# Patient Record
Sex: Female | Born: 1955 | Race: White | Hispanic: No | Marital: Married | Smoking: Former smoker
Health system: Southern US, Community
[De-identification: ages and names within clinical notes are randomized; demographics above are authoritative.]

## PROBLEM LIST (undated history)

## (undated) DIAGNOSIS — I10 Essential (primary) hypertension: Secondary | ICD-10-CM

## (undated) HISTORY — DX: Essential (primary) hypertension: I10

---

## 2004-04-18 ENCOUNTER — Ambulatory Visit: Payer: Self-pay | Admitting: Unknown Physician Specialty

## 2005-05-11 ENCOUNTER — Ambulatory Visit: Payer: Self-pay | Admitting: Unknown Physician Specialty

## 2005-05-17 ENCOUNTER — Ambulatory Visit: Payer: Self-pay | Admitting: Unknown Physician Specialty

## 2005-07-27 LAB — HM COLONOSCOPY: HM Colonoscopy: NORMAL

## 2005-08-02 ENCOUNTER — Ambulatory Visit: Payer: Self-pay | Admitting: Gastroenterology

## 2006-05-14 ENCOUNTER — Ambulatory Visit: Payer: Self-pay | Admitting: Unknown Physician Specialty

## 2006-05-16 ENCOUNTER — Ambulatory Visit: Payer: Self-pay | Admitting: Unknown Physician Specialty

## 2007-05-20 ENCOUNTER — Ambulatory Visit: Payer: Self-pay | Admitting: Unknown Physician Specialty

## 2008-05-25 ENCOUNTER — Ambulatory Visit: Payer: Self-pay | Admitting: Unknown Physician Specialty

## 2009-07-27 ENCOUNTER — Ambulatory Visit: Payer: Self-pay | Admitting: General Practice

## 2010-02-26 LAB — HM PAP SMEAR: HM Pap smear: NORMAL

## 2010-08-03 ENCOUNTER — Ambulatory Visit: Payer: Self-pay | Admitting: General Practice

## 2012-01-15 ENCOUNTER — Ambulatory Visit: Payer: Self-pay | Admitting: General Practice

## 2012-08-06 ENCOUNTER — Ambulatory Visit: Payer: Self-pay | Admitting: General Practice

## 2013-12-27 LAB — HM MAMMOGRAPHY: HM MAMMO: NORMAL

## 2014-01-21 ENCOUNTER — Ambulatory Visit: Payer: Self-pay | Admitting: General Practice

## 2014-02-26 ENCOUNTER — Encounter (INDEPENDENT_AMBULATORY_CARE_PROVIDER_SITE_OTHER): Payer: Self-pay

## 2014-02-26 ENCOUNTER — Encounter: Payer: Self-pay | Admitting: Internal Medicine

## 2014-02-26 ENCOUNTER — Ambulatory Visit (INDEPENDENT_AMBULATORY_CARE_PROVIDER_SITE_OTHER): Payer: No Typology Code available for payment source | Admitting: Internal Medicine

## 2014-02-26 VITALS — BP 124/74 | HR 84 | Temp 98.1°F | Resp 16 | Ht 63.0 in | Wt 139.0 lb

## 2014-02-26 DIAGNOSIS — Z0000 Encounter for general adult medical examination without abnormal findings: Secondary | ICD-10-CM

## 2014-02-26 DIAGNOSIS — Z0001 Encounter for general adult medical examination with abnormal findings: Secondary | ICD-10-CM | POA: Insufficient documentation

## 2014-02-26 DIAGNOSIS — M19041 Primary osteoarthritis, right hand: Secondary | ICD-10-CM

## 2014-02-26 DIAGNOSIS — L900 Lichen sclerosus et atrophicus: Secondary | ICD-10-CM

## 2014-02-26 DIAGNOSIS — I10 Essential (primary) hypertension: Secondary | ICD-10-CM

## 2014-02-26 DIAGNOSIS — Z124 Encounter for screening for malignant neoplasm of cervix: Secondary | ICD-10-CM

## 2014-02-26 MED ORDER — CLOBETASOL PROPIONATE 0.05 % EX OINT: 1.0000 "application " | TOPICAL_OINTMENT | Freq: Two times a day (BID) | CUTANEOUS | Status: DC

## 2014-02-26 NOTE — Progress Notes (Signed)
Patient ID: Elizabeth Hanna, female   DOB: [birth year], 58 y.o.   MRN: [id number]   Subjective:    Elizabeth Hanna is a 58 y.o. female who presents for an annual exam. The patient has no complaints today. The patient is not currently sexually active. GYN screening history: last pap: approximate date 2012 and was normal. The patient wears seatbelts: yes. The patient participates in regular exercise: yes. Has the patient ever been transfused or tattooed?: no. The patient reports that there is not domestic violence in her life.   Menstrual History: OB History   Grav Para Term Preterm Abortions TAB SAB Ect Mult Living                  Menarche age: 64  No LMP recorded. Patient is postmenopausal.    The following portions of the patient's history were reviewed and updated as appropriate: allergies, current medications, past family history, past medical history, past social history, past surgical history and problem list.  Review of Systems A comprehensive review of systems was negative.    Objective:      General Appearance:    Alert, cooperative, no distress, appears stated age  Head:    Normocephalic, without obvious abnormality, atraumatic  Eyes:    PERRL, conjunctiva/corneas clear, EOM's intact, fundi    benign, both eyes  Ears:    Normal TM's and external ear canals, both ears  Nose:   Nares normal, septum midline, mucosa normal, no drainage    or sinus tenderness  Throat:   Lips, mucosa, and tongue normal; teeth and gums normal  Neck:   Supple, symmetrical, trachea midline, no adenopathy;    thyroid:  no enlargement/tenderness/nodules; no carotid   bruit or JVD  Back:     Symmetric, no curvature, ROM normal, no CVA tenderness  Lungs:     Clear to auscultation bilaterally, respirations unlabored  Chest Wall:    No tenderness or deformity   Heart:    Regular rate and rhythm, S1 and S2 normal, no murmur, rub   or gallop  Breast Exam:    No tenderness, masses, or nipple  abnormality  Abdomen:     Soft, non-tender, bowel sounds active all four quadrants,    no masses, no organomegaly  Genitalia:    Pelvic: cervix normal in appearance, external genitalia normal except for , no adnexal masses or tenderness, no cervical motion tenderness, rectovaginal septum normal, uterus normal size, shape, and consistency and vagina normal without discharge  Extremities:   Extremities normal, atraumatic, no cyanosis or edema  Pulses:   2+ and symmetric all extremities  Skin:   Skin color, texture, turgor normal, no rashes or lesions  Lymph nodes:   Cervical, supraclavicular, and axillary nodes normal  Neurologic:   CNII-XII intact, normal strength, sensation and reflexes    throughout   .    Assessment and Plan:   Essential hypertension Well controlled on current regimen. no changes today.  Visit for preventive health examination Annual wellness  exam was done as well as a comprehensive physical exam and management of acute and chronic conditions .  During the course of the visit the patient was educated and counseled about appropriate screening and preventive services including : fall prevention , diabetes screening, nutrition counseling, colorectal cancer screening, and recommended immunizations.  Printed recommendations for health maintenance screenings was given.   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  Trial of clobetasol bid x 4 weeks  Updated Medication List Outpatient Encounter Prescriptions as of 02/26/2014  Medication Sig  . clobetasol ointment (TEMOVATE) 0.05 % Apply 1 application topically 2 (two) times daily.  . metoprolol tartrate (LOPRESSOR) 25 MG tablet Take 25 mg by mouth daily.

## 2014-02-26 NOTE — Patient Instructions (Addendum)
I am treating you for a condition called "Lichen sclerosis et atrophics" that affects the vaginal folds often after menopasue,  Use the steroid cream twice daily for 4 weeks,  Then suspend,    This is  my version of a  "Low GI"  Diet:  It will still lower your blood sugars and allow you to lose 4 to 8  lbs  per month if you follow it carefully.  Your goal with exercise is a minimum of 30 minutes of aerobic exercise 5 days per week (Walking does not count once it becomes easy!)      All of the foods can be found at grocery stores and in bulk at [employer].  The Atkins protein bars and shakes are available in more varieties at [employer], [employer] and [place].     7 AM Breakfast:  Choose from the following:  Low carbohydrate Protein  Shakes (I recommend the EAS AdvantEdge "Carb Control" shakes  Or the low carb shakes by Atkins.    2.5 carbs   Arnold's "Sandwhich Thin"toasted  w/ peanut butter (no jelly: about 20 net carbs  "Bagel Thin" with cream cheese and salmon: about 20 carbs   a scrambled egg/bacon/cheese burrito made with Mission's "carb balance" whole wheat tortilla  (about 10 net carbs )  A slice of home made fritatta (egg based dish without a crust:  google it)    Avoid cereal and bananas, oatmeal and cream of wheat and grits. They are loaded with carbohydrates!   10 AM: high protein snack  Protein bar by Atkins (the snack size, under 200 cal, usually < 6 net carbs).    A stick of cheese:  Around 1 carb,  100 cal     Dannon Light n Fit [place] Yogurt  (80 cal, 8 carbs)  Other so called "protein bars" and Greek yogurts tend to be loaded with carbohydrates.  Remember, in food advertising, the word "energy" is synonymous for " carbohydrate."  Lunch:   A Sandwich using the bread choices listed, Can use any  Eggs,  lunchmeat, grilled meat or canned tuna), avocado, regular mayo/mustard  and cheese.  A Salad using blue cheese, ranch,  Goddess or vinagrette,  No croutons or "confetti" and no  "candied nuts" but regular nuts OK.   No pretzels or chips.  Pickles and miniature sweet peppers are a good low carb alternative that provide a "crunch"  The bread is the only source of carbohydrate in a sandwich and  can be decreased by trying some of these alternatives to traditional loaf bread  Joseph's makes a pita bread and a flat bread that are 50 cal and 4 net carbs available at [place] and [address].  This can be toasted to use with hummous as well  Toufayan makes a low carb flatbread that's 100 cal and 9 net carbs available at [employer] and [employer]'s makes 2 sizes of  Low carb whole wheat tortilla  (The large one is 210 cal and 6 net carbs) Avoid "Low fat dressings, as well as Barry Brunner and [place] dressings They are loaded with sugar!   3 PM/ Mid day  Snack:  Consider  1 ounce of  almonds, walnuts, pistachios, pecans, peanuts,  Macadamia nuts or a nut medley.  Avoid "granola"; the dried cranberries and raisins are loaded with carbohydrates. Mixed nuts as long as there are no raisins,  cranberries or dried fruit.    Try the prosciutto/mozzarella cheese sticks by [employer]  In deli /backery section   High protein   To avoid overindulging in snacks: Try drinking a glass of unsweeted almond/coconut milk  Or a cup of coffee with your Atkins chocolate bar t o keep you from having 3!!!        6 PM  Dinner:     Meat/fowl/fish with a green salad, and either broccoli, cauliflower, green beans, spinach, brussel sprouts or  Lima beans. DO NOT BREAD THE PROTEIN!!      There is a low carb pasta by Dreamfield's that is acceptable and tastes great: only 5 digestible carbs/serving.( All grocery stores but BJs carry it )  Try Hurley Cisco Angelo's chicken piccata or chicken or eggplant parm over low carb pasta.(Lowes and BJs)   Marjory Lies Sanchez's "Carnitas" (pulled pork, no sauce,  0 carbs) or his beef pot roast to make a dinner burrito (at BJ's)  Pesto over low carb pasta (bj's sells a good quality pesto  in the center refrigerated section of the deli   Try satueeing  Cheral Marker with mushroooms  Whole wheat pasta is still full of digestible carbs and  Not as low in glycemic index as Dreamfield's.   Brown rice is still rice,  So skip the rice and noodles if you eat [place] or [place] (or at least limit to 1/2 cup)  9 PM snack :   Breyer's "low carb" fudgsicle or  ice cream bar (Carb Smart line), or  Weight Watcher's ice cream bar , or another "no sugar added" ice cream;  a serving of fresh berries/cherries with whipped cream   Cheese or DANNON'S LlGHT N FIT GREEK YOGURT or the [place] greek yogurt   8 ounces of [place] unsweetened almond/cococunut milk    Avoid bananas, pineapple, grapes  and watermelon on a regular basis because they are high in sugar.  THINK OF THEM AS DESSERT  Remember that snack Substitutions should be less than 10 NET carbs per serving and meals < 20 carbs. Remember to subtract fiber grams to get the "net carbs."  Health Maintenance Adopting a healthy lifestyle and getting preventive care can go a long way to promote health and wellness. Talk with your health care provider about what schedule of regular examinations is right for you. This is a good chance for you to check in with your provider about disease prevention and staying healthy. In between checkups, there are plenty of things you can do on your own. Experts have done a lot of research about which lifestyle changes and preventive measures are most likely to keep you healthy. Ask your health care provider for more information. WEIGHT AND DIET  Eat a healthy diet  Be sure to include plenty of vegetables, fruits, low-fat dairy products, and lean protein.  Do not eat a lot of foods high in solid fats, added sugars, or salt.  Get regular exercise. This is one of the most important things you can do for your health.  Most adults should exercise for at least 150 minutes each week. The exercise should increase your  heart rate and make you sweat (moderate-intensity exercise).  Most adults should also do strengthening exercises at least twice a week. This is in addition to the moderate-intensity exercise.  Maintain a healthy weight  Body mass index (BMI) is a measurement that can be used to identify possible weight problems. It estimates body fat based on height and weight. Your health care provider can help determine your BMI and help you achieve or maintain  a healthy weight.  For females 73 years of age and older:   A BMI below 18.5 is considered underweight.  A BMI of 18.5 to 24.9 is normal.  A BMI of 25 to 29.9 is considered overweight.  A BMI of 30 and above is considered obese.  Watch levels of cholesterol and blood lipids  You should start having your blood tested for lipids and cholesterol at 59 years of age, then have this test every 5 years.  You may need to have your cholesterol levels checked more often if:  Your lipid or cholesterol levels are high.  You are older than 59 years of age.  You are at high risk for heart disease.  CANCER SCREENING   Lung Cancer  Lung cancer screening is recommended for adults 39-85 years old who are at high risk for lung cancer because of a history of smoking.  A yearly low-dose CT scan of the lungs is recommended for people who:  Currently smoke.  Have quit within the past 15 years.  Have at least a 30-pack-year history of smoking. A pack year is smoking an average of one pack of cigarettes a day for 1 year.  Yearly screening should continue until it has been 15 years since you quit.  Yearly screening should stop if you develop a health problem that would prevent you from having lung cancer treatment.  Breast Cancer  Practice breast self-awareness. This means understanding how your breasts normally appear and feel.  It also means doing regular breast self-exams. Let your health care provider know about any changes, no matter how  small.  If you are in your 20s or 30s, you should have a clinical breast exam (CBE) by a health care provider every 1-3 years as part of a regular health exam.  If you are 80 or older, have a CBE every year. Also consider having a breast X-ray (mammogram) every year.  If you have a family history of breast cancer, talk to your health care provider about genetic screening.  If you are at high risk for breast cancer, talk to your health care provider about having an MRI and a mammogram every year.  Breast cancer gene (BRCA) assessment is recommended for women who have family members with BRCA-related cancers. BRCA-related cancers include:  Breast.  Ovarian.  Tubal.  Peritoneal cancers.  Results of the assessment will determine the need for genetic counseling and BRCA1 and BRCA2 testing. Cervical Cancer Routine pelvic examinations to screen for cervical cancer are no longer recommended for nonpregnant women who are considered low risk for cancer of the pelvic organs (ovaries, uterus, and vagina) and who do not have symptoms. A pelvic examination may be necessary if you have symptoms including those associated with pelvic infections. Ask your health care provider if a screening pelvic exam is right for you.   The Pap test is the screening test for cervical cancer for women who are considered at risk.  If you had a hysterectomy for a problem that was not cancer or a condition that could lead to cancer, then you no longer need Pap tests.  If you are older than 65 years, and you have had normal Pap tests for the past 10 years, you no longer need to have Pap tests.  If you have had past treatment for cervical cancer or a condition that could lead to cancer, you need Pap tests and screening for cancer for at least 20 years after your treatment.  If you  no longer get a Pap test, assess your risk factors if they change (such as having a new sexual partner). This can affect whether you should  start being screened again.  Some women have medical problems that increase their chance of getting cervical cancer. If this is the case for you, your health care provider may recommend more frequent screening and Pap tests.  The human papillomavirus (HPV) test is another test that may be used for cervical cancer screening. The HPV test looks for the virus that can cause cell changes in the cervix. The cells collected during the Pap test can be tested for HPV.  The HPV test can be used to screen women 51 years of age and older. Getting tested for HPV can extend the interval between normal Pap tests from three to five years.  An HPV test also should be used to screen women of any age who have unclear Pap test results.  After 59 years of age, women should have HPV testing as often as Pap tests.  Colorectal Cancer  This type of cancer can be detected and often prevented.  Routine colorectal cancer screening usually begins at 59 years of age and continues through 59 years of age.  Your health care provider may recommend screening at an earlier age if you have risk factors for colon cancer.  Your health care provider may also recommend using home test kits to check for hidden blood in the stool.  A small camera at the end of a tube can be used to examine your colon directly (sigmoidoscopy or colonoscopy). This is done to check for the earliest forms of colorectal cancer.  Routine screening usually begins at age 26.  Direct examination of the colon should be repeated every 5-10 years through 59 years of age. However, you may need to be screened more often if early forms of precancerous polyps or small growths are found. Skin Cancer  Check your skin from head to toe regularly.  Tell your health care provider about any new moles or changes in moles, especially if there is a change in a mole's shape or color.  Also tell your health care provider if you have a mole that is larger than the size  of a pencil eraser.  Always use sunscreen. Apply sunscreen liberally and repeatedly throughout the day.  Protect yourself by wearing long sleeves, pants, a wide-brimmed hat, and sunglasses whenever you are outside. HEART DISEASE, DIABETES, AND HIGH BLOOD PRESSURE   Have your blood pressure checked at least every 1-2 years. High blood pressure causes heart disease and increases the risk of stroke.  If you are between 56 years and 62 years old, ask your health care provider if you should take aspirin to prevent strokes.  Have regular diabetes screenings. This involves taking a blood sample to check your fasting blood sugar level.  If you are at a normal weight and have a low risk for diabetes, have this test once every three years after 59 years of age.  If you are overweight and have a high risk for diabetes, consider being tested at a younger age or more often. PREVENTING INFECTION  Hepatitis B  If you have a higher risk for hepatitis B, you should be screened for this virus. You are considered at high risk for hepatitis B if:  You were born in a country where hepatitis B is common. Ask your health care provider which countries are considered high risk.  Your parents were born in  a high-risk country, and you have not been immunized against hepatitis B (hepatitis B vaccine).  You have HIV or AIDS.  You use needles to inject street drugs.  You live with someone who has hepatitis B.  You have had sex with someone who has hepatitis B.  You get hemodialysis treatment.  You take certain medicines for conditions, including cancer, organ transplantation, and autoimmune conditions. Hepatitis C  Blood testing is recommended for:  Everyone born from [birth year] through 1966.  Anyone with known risk factors for hepatitis C. Sexually transmitted infections (STIs)  You should be screened for sexually transmitted infections (STIs) including gonorrhea and chlamydia if:  You are sexually  active and are younger than 59 years of age.  You are older than 59 years of age and your health care provider tells you that you are at risk for this type of infection.  Your sexual activity has changed since you were last screened and you are at an increased risk for chlamydia or gonorrhea. Ask your health care provider if you are at risk.  If you do not have HIV, but are at risk, it may be recommended that you take a prescription medicine daily to prevent HIV infection. This is called pre-exposure prophylaxis (PrEP). You are considered at risk if:  You are sexually active and do not regularly use condoms or know the HIV status of your partner(s).  You take drugs by injection.  You are sexually active with a partner who has HIV. Talk with your health care provider about whether you are at high risk of being infected with HIV. If you choose to begin PrEP, you should first be tested for HIV. You should then be tested every 3 months for as long as you are taking PrEP.  PREGNANCY   If you are premenopausal and you may become pregnant, ask your health care provider about preconception counseling.  If you may become pregnant, take 400 to 800 micrograms (mcg) of folic acid every day.  If you want to prevent pregnancy, talk to your health care provider about birth control (contraception). OSTEOPOROSIS AND MENOPAUSE   Osteoporosis is a disease in which the bones lose minerals and strength with aging. This can result in serious bone fractures. Your risk for osteoporosis can be identified using a bone density scan.  If you are 62 years of age or older, or if you are at risk for osteoporosis and fractures, ask your health care provider if you should be screened.  Ask your health care provider whether you should take a calcium or vitamin D supplement to lower your risk for osteoporosis.  Menopause may have certain physical symptoms and risks.  Hormone replacement therapy may reduce some of these  symptoms and risks. Talk to your health care provider about whether hormone replacement therapy is right for you.  HOME CARE INSTRUCTIONS   Schedule regular health, dental, and eye exams.  Stay current with your immunizations.   Do not use any tobacco products including cigarettes, chewing tobacco, or electronic cigarettes.  If you are pregnant, do not drink alcohol.  If you are breastfeeding, limit how much and how often you drink alcohol.  Limit alcohol intake to no more than 1 drink per day for nonpregnant women. One drink equals 12 ounces of beer, 5 ounces of wine, or 1 ounces of hard liquor.  Do not use street drugs.  Do not share needles.  Ask your health care provider for help if you need support or information  about quitting drugs.  Tell your health care provider if you often feel depressed.  Tell your health care provider if you have ever been abused or do not feel safe at home. Document Released: 11/27/2010 Document Revised: 09/28/2013 Document Reviewed: 04/15/2013 [facility] Patient Information 2015 [place], [place]. This information is not intended to replace advice given to you by your health care provider. Make sure you discuss any questions you have with your health care provider.

## 2014-02-26 NOTE — Progress Notes (Signed)
Pre-visit discussion using our clinic review tool. No additional management support is needed unless otherwise documented below in the visit note.  

## 2014-02-28 ENCOUNTER — Encounter: Payer: Self-pay | Admitting: Internal Medicine

## 2014-02-28 DIAGNOSIS — L900 Lichen sclerosus et atrophicus: Secondary | ICD-10-CM | POA: Insufficient documentation

## 2014-02-28 NOTE — Assessment & Plan Note (Addendum)
Well controlled on current regimen.  no changes today.   

## 2014-02-28 NOTE — Assessment & Plan Note (Signed)
Affecting the right labia.  Trial of clobetasol bid x 4 weeks

## 2014-02-28 NOTE — Assessment & Plan Note (Addendum)
Annual  wellness  exam was done as well as a comprehensive physical exam and management of acute and chronic conditions .  During the course of the visit the patient was educated and counseled about appropriate screening and preventive services including : fall prevention , diabetes screening, nutrition counseling, colorectal cancer screening, and recommended immunizations.  Printed recommendations for health maintenance screenings was given.  

## 2014-03-01 ENCOUNTER — Other Ambulatory Visit (HOSPITAL_COMMUNITY)
Admission: RE | Admit: 2014-03-01 | Discharge: 2014-03-01 | Disposition: A | Discharge status: Home / Self Care | Payer: No Typology Code available for payment source | Source: Ambulatory Visit | Attending: Internal Medicine | Admitting: Internal Medicine

## 2014-03-01 ENCOUNTER — Telehealth: Payer: Self-pay | Admitting: Internal Medicine

## 2014-03-01 DIAGNOSIS — Z1151 Encounter for screening for human papillomavirus (HPV): Secondary | ICD-10-CM | POA: Diagnosis present

## 2014-03-01 DIAGNOSIS — Z01411 Encounter for gynecological examination (general) (routine) with abnormal findings: Secondary | ICD-10-CM | POA: Diagnosis not present

## 2014-03-01 NOTE — Addendum Note (Signed)
Addended by: Montine CircleMALDONADO, Alisson Rozell D on: 03/01/2014 04:39 PM   Modules accepted: Orders

## 2014-03-01 NOTE — Telephone Encounter (Signed)
emmi emailed °

## 2014-03-02 LAB — CYTOLOGY - PAP

## 2015-04-04 ENCOUNTER — Encounter: Payer: Self-pay | Admitting: Physician Assistant

## 2015-04-04 ENCOUNTER — Ambulatory Visit: Payer: Self-pay | Admitting: Physician Assistant

## 2015-04-04 VITALS — BP 132/70 | HR 86 | Temp 98.1°F | Wt 146.2 lb

## 2015-04-04 DIAGNOSIS — Z299 Encounter for prophylactic measures, unspecified: Secondary | ICD-10-CM

## 2015-04-04 DIAGNOSIS — I10 Essential (primary) hypertension: Secondary | ICD-10-CM

## 2015-04-04 MED ORDER — METOPROLOL TARTRATE 25 MG PO TABS: 25.0000 mg | ORAL_TABLET | Freq: Every day | ORAL | Status: DC

## 2015-04-04 NOTE — Progress Notes (Signed)
S: here for yearly exam, no changes in her health in past year, has gained a little weight and had to go up a size in pants, still working out at gym regularly and trying to eat right, had a couple of chest like pressures that passed in few seconds, denies cp/sob/dizziness during workouts, denies changes in bowel habits, chronic cough, etc; remainder ros neg  O: vitals wnl nad, normocephalic Ent:  tms clear, nasal mucosa pink, throat wnl, neck supple no lymph Lungs: Normal effort. Lungs are clear to auscultation, no crackles or wheezes Cardiovascular: Regular rate and rhythm, no edema Abd: soft nontender bs normal all 4 quads Musculoskeletal:  Neurovascularly intact Neurological: No new neurological deficits  A: htn controlled with medication  P: labs also send copy to dr Darrick Huntsmantullo as she is pt's pcp; pt to add more protein into diet and eat 6 small meals to aid in weight loss, [facility], pt needs colonoscopy as its been 10 years, wants to wait until first of year and will have pcp order this

## 2015-04-04 NOTE — Addendum Note (Signed)
Addended by: Catha BrowEACON, MONIQUE T on: 04/04/2015 09:11 AM   Modules accepted: Orders

## 2015-04-05 LAB — CMP12+LP+TP+TSH+6AC+CBC/D/PLT
ALBUMIN: 4.4 g/dL (ref 3.6–4.8)
ALK PHOS: 52 IU/L (ref 39–117)
ALT: 16 IU/L (ref 0–32)
AST: 17 IU/L (ref 0–40)
Albumin/Globulin Ratio: 2.3 (ref 1.1–2.5)
BASOS: 1 %
BILIRUBIN TOTAL: 0.5 mg/dL (ref 0.0–1.2)
BUN/Creatinine Ratio: 25 (ref 11–26)
BUN: 14 mg/dL (ref 8–27)
Basophils Absolute: 0 10*3/uL (ref 0.0–0.2)
CALCIUM: 9.4 mg/dL (ref 8.7–10.3)
Chloride: 101 mmol/L (ref 97–106)
Chol/HDL Ratio: 2.4 ratio units (ref 0.0–4.4)
Cholesterol, Total: 192 mg/dL (ref 100–199)
Creatinine, Ser: 0.56 mg/dL — ABNORMAL LOW (ref 0.57–1.00)
EOS (ABSOLUTE): 0.1 10*3/uL (ref 0.0–0.4)
Eos: 1 %
Estimated CHD Risk: <0.5 times avg. (ref 0.0–1.0)
Free Thyroxine Index: 2.5 (ref 1.2–4.9)
GFR calc non Af Amer: 102 mL/min/{1.73_m2} (ref >59)
GFR, EST AFRICAN AMERICAN: 117 mL/min/{1.73_m2} (ref >59)
GGT: 15 IU/L (ref 0–60)
GLOBULIN, TOTAL: 1.9 g/dL (ref 1.5–4.5)
Glucose: 99 mg/dL (ref 65–99)
HDL: 79 mg/dL (ref >39)
HEMATOCRIT: 44 % (ref 34.0–46.6)
Hemoglobin: 14.6 g/dL (ref 11.1–15.9)
IMMATURE GRANS (ABS): 0 10*3/uL (ref 0.0–0.1)
Immature Granulocytes: 0 %
Iron: 145 ug/dL (ref 27–159)
LDH: 161 IU/L (ref 119–226)
LDL CALC: 100 mg/dL — AB (ref 0–99)
LYMPHS: 36 %
Lymphocytes Absolute: 2.1 10*3/uL (ref 0.7–3.1)
MCH: 30.5 pg (ref 26.6–33.0)
MCHC: 33.2 g/dL (ref 31.5–35.7)
MCV: 92 fL (ref 79–97)
MONOCYTES: 7 %
MONOS ABS: 0.4 10*3/uL (ref 0.1–0.9)
NEUTROS ABS: 3.2 10*3/uL (ref 1.4–7.0)
Neutrophils: 55 %
POTASSIUM: 4.3 mmol/L (ref 3.5–5.2)
Phosphorus: 3.2 mg/dL (ref 2.5–4.5)
Platelets: 262 10*3/uL (ref 150–379)
RBC: 4.78 x10E6/uL (ref 3.77–5.28)
RDW: 12.6 % (ref 12.3–15.4)
SODIUM: 140 mmol/L (ref 136–144)
T3 Uptake Ratio: 28 % (ref 24–39)
T4, Total: 8.8 ug/dL (ref 4.5–12.0)
TRIGLYCERIDES: 63 mg/dL (ref 0–149)
TSH: 1.59 u[IU]/mL (ref 0.450–4.500)
Total Protein: 6.3 g/dL (ref 6.0–8.5)
Uric Acid: 4.7 mg/dL (ref 2.5–7.1)
VLDL CHOLESTEROL CAL: 13 mg/dL (ref 5–40)
WBC: 5.8 10*3/uL (ref 3.4–10.8)

## 2015-04-05 LAB — HEPATITIS C ANTIBODY (REFLEX): HCV Ab: <0.1 s/co ratio (ref 0.0–0.9)

## 2015-04-05 LAB — HCV COMMENT:

## 2015-04-05 LAB — VITAMIN D 25 HYDROXY (VIT D DEFICIENCY, FRACTURES): Vit D, 25-Hydroxy: 22.9 ng/mL — ABNORMAL LOW (ref 30.0–100.0)

## 2015-04-08 ENCOUNTER — Encounter: Payer: Self-pay | Admitting: Emergency Medicine

## 2015-04-08 NOTE — Progress Notes (Signed)
Lab results were mailed to patient's home address.

## 2015-05-31 ENCOUNTER — Ambulatory Visit (INDEPENDENT_AMBULATORY_CARE_PROVIDER_SITE_OTHER): Payer: 59 | Admitting: Internal Medicine

## 2015-05-31 ENCOUNTER — Encounter: Payer: Self-pay | Admitting: Internal Medicine

## 2015-05-31 ENCOUNTER — Other Ambulatory Visit (HOSPITAL_COMMUNITY)
Admission: RE | Admit: 2015-05-31 | Discharge: 2015-05-31 | Disposition: A | Discharge status: Home / Self Care | Payer: 59 | Source: Ambulatory Visit | Attending: Internal Medicine | Admitting: Internal Medicine

## 2015-05-31 VITALS — BP 128/78 | HR 70 | Temp 97.9°F | Resp 12 | Ht 63.0 in | Wt 149.1 lb

## 2015-05-31 DIAGNOSIS — Z01411 Encounter for gynecological examination (general) (routine) with abnormal findings: Secondary | ICD-10-CM | POA: Diagnosis present

## 2015-05-31 DIAGNOSIS — L900 Lichen sclerosus et atrophicus: Secondary | ICD-10-CM | POA: Diagnosis not present

## 2015-05-31 DIAGNOSIS — R896 Abnormal cytological findings in specimens from other organs, systems and tissues: Secondary | ICD-10-CM | POA: Diagnosis not present

## 2015-05-31 DIAGNOSIS — IMO0002 Reserved for concepts with insufficient information to code with codable children: Secondary | ICD-10-CM

## 2015-05-31 DIAGNOSIS — Z1151 Encounter for screening for human papillomavirus (HPV): Secondary | ICD-10-CM | POA: Insufficient documentation

## 2015-05-31 DIAGNOSIS — E559 Vitamin D deficiency, unspecified: Secondary | ICD-10-CM | POA: Diagnosis not present

## 2015-05-31 DIAGNOSIS — B977 Papillomavirus as the cause of diseases classified elsewhere: Secondary | ICD-10-CM

## 2015-05-31 DIAGNOSIS — Z0000 Encounter for general adult medical examination without abnormal findings: Secondary | ICD-10-CM | POA: Diagnosis not present

## 2015-05-31 MED ORDER — CLOBETASOL PROPIONATE 0.05 % EX OINT: 1.0000 "application " | TOPICAL_OINTMENT | Freq: Two times a day (BID) | CUTANEOUS | Status: DC

## 2015-05-31 MED ORDER — ERGOCALCIFEROL 1.25 MG (50000 UT) PO CAPS: 50000.0000 [IU] | ORAL_CAPSULE | ORAL | Status: DC

## 2015-05-31 NOTE — Patient Instructions (Signed)
Your vitamin D is low, which can increase your risk of weak bones and fractures and interfere with your body's ability to absorb the calcium in your diet.   I am calling in a megadose of Vit D to take once weekly for a total of 3 months,  Then after you finish the weekly supplement, you should start taking an OTC  Vit D3 supplement 1000 units daily.    If your PAP smear is abnormal again,  I will recommend that you see my gynecologist Dr Servando Salina for further evaluation  I have refilled the clobetasol ointment  I recommend getting the 3D mammogram ANNUALLY  for dense breasts   Menopause is a normal process in which your reproductive ability comes to an end. This process happens gradually over a span of months to years, usually between the ages of 42 and 68. Menopause is complete when you have missed 12 consecutive menstrual periods. It is important to talk with your health care provider about some of the most common conditions that affect postmenopausal women, such as heart disease, cancer, and bone loss (osteoporosis). Adopting a healthy lifestyle and getting preventive care can help to promote your health and wellness. Those actions can also lower your chances of developing some of these common conditions. WHAT SHOULD I KNOW ABOUT MENOPAUSE? During menopause, you may experience a number of symptoms, such as:  Moderate-to-severe hot flashes.  Night sweats.  Decrease in sex drive.  Mood swings.  Headaches.  Tiredness.  Irritability.  Memory problems.  Insomnia. Choosing to treat or not to treat menopausal changes is an individual decision that you make with your health care provider. WHAT SHOULD I KNOW ABOUT HORMONE REPLACEMENT THERAPY AND SUPPLEMENTS? Hormone therapy products are effective for treating symptoms that are associated with menopause, such as hot flashes and night sweats. Hormone replacement carries certain risks, especially as you become older. If you are  thinking about using estrogen or estrogen with progestin treatments, discuss the benefits and risks with your health care provider. WHAT SHOULD I KNOW ABOUT HEART DISEASE AND STROKE? Heart disease, heart attack, and stroke become more likely as you age. This may be due, in part, to the hormonal changes that your body experiences during menopause. These can affect how your body processes dietary fats, triglycerides, and cholesterol. Heart attack and stroke are both medical emergencies. There are many things that you can do to help prevent heart disease and stroke:  Have your blood pressure checked at least every 1-2 years. High blood pressure causes heart disease and increases the risk of stroke.  If you are 6-12 years old, ask your health care provider if you should take aspirin to prevent a heart attack or a stroke.  Do not use any tobacco products, including cigarettes, chewing tobacco, or electronic cigarettes. If you need help quitting, ask your health care provider.  It is important to eat a healthy diet and maintain a healthy weight.  Be sure to include plenty of vegetables, fruits, low-fat dairy products, and lean protein.  Avoid eating foods that are high in solid fats, added sugars, or salt (sodium).  Get regular exercise. This is one of the most important things that you can do for your health.  Try to exercise for at least 150 minutes each week. The type of exercise that you do should increase your heart rate and make you sweat. This is known as moderate-intensity exercise.  Try to do strengthening exercises at least twice each week. Do these  in addition to the moderate-intensity exercise.  Know your numbers.Ask your health care provider to check your cholesterol and your blood glucose. Continue to have your blood tested as directed by your health care provider. WHAT SHOULD I KNOW ABOUT CANCER SCREENING? There are several types of cancer. Take the following steps to reduce your  risk and to catch any cancer development as early as possible. Breast Cancer  Practice breast self-awareness.  This means understanding how your breasts normally appear and feel.  It also means doing regular breast self-exams. Let your health care provider know about any changes, no matter how small.  If you are 60 or older, have a clinician do a breast exam (clinical breast exam or CBE) every year. Depending on your age, family history, and medical history, it may be recommended that you also have a yearly breast X-ray (mammogram).  If you have a family history of breast cancer, talk with your health care provider about genetic screening.  If you are at high risk for breast cancer, talk with your health care provider about having an MRI and a mammogram every year.  Breast cancer (BRCA) gene test is recommended for women who have family members with BRCA-related cancers. Results of the assessment will determine the need for genetic counseling and BRCA1 and for BRCA2 testing. BRCA-related cancers include these types:  Breast. This occurs in males or females.  Ovarian.  Tubal. This may also be called fallopian tube cancer.  Cancer of the abdominal or pelvic lining (peritoneal cancer).  Prostate.  Pancreatic. Cervical, Uterine, and Ovarian Cancer Your health care provider may recommend that you be screened regularly for cancer of the pelvic organs. These include your ovaries, uterus, and vagina. This screening involves a pelvic exam, which includes checking for microscopic changes to the surface of your cervix (Pap test).  For women ages 21-65, health care providers may recommend a pelvic exam and a Pap test every three years. For women ages 51-65, they may recommend the Pap test and pelvic exam, combined with testing for human papilloma virus (HPV), every five years. Some types of HPV increase your risk of cervical cancer. Testing for HPV may also be done on women of any age who have  unclear Pap test results.  Other health care providers may not recommend any screening for nonpregnant women who are considered low risk for pelvic cancer and have no symptoms. Ask your health care provider if a screening pelvic exam is right for you.  If you have had past treatment for cervical cancer or a condition that could lead to cancer, you need Pap tests and screening for cancer for at least 20 years after your treatment. If Pap tests have been discontinued for you, your risk factors (such as having a new sexual partner) need to be reassessed to determine if you should start having screenings again. Some women have medical problems that increase the chance of getting cervical cancer. In these cases, your health care provider may recommend that you have screening and Pap tests more often.  If you have a family history of uterine cancer or ovarian cancer, talk with your health care provider about genetic screening.  If you have vaginal bleeding after reaching menopause, tell your health care provider.  There are currently no reliable tests available to screen for ovarian cancer. Lung Cancer Lung cancer screening is recommended for adults 73-57 years old who are at high risk for lung cancer because of a history of smoking. A yearly low-dose  CT scan of the lungs is recommended if you:  Currently smoke.  Have a history of at least 30 pack-years of smoking and you currently smoke or have quit within the past 15 years. A pack-year is smoking an average of one pack of cigarettes per day for one year. Yearly screening should:  Continue until it has been 15 years since you quit.  Stop if you develop a health problem that would prevent you from having lung cancer treatment. Colorectal Cancer  This type of cancer can be detected and can often be prevented.  Routine colorectal cancer screening usually begins at age 98 and continues through age 9.  If you have risk factors for colon cancer,  your health care provider may recommend that you be screened at an earlier age.  If you have a family history of colorectal cancer, talk with your health care provider about genetic screening.  Your health care provider may also recommend using home test kits to check for hidden blood in your stool.  A small camera at the end of a tube can be used to examine your colon directly (sigmoidoscopy or colonoscopy). This is done to check for the earliest forms of colorectal cancer.  Direct examination of the colon should be repeated every 5-10 years until age 29. However, if early forms of precancerous polyps or small growths are found or if you have a family history or genetic risk for colorectal cancer, you may need to be screened more often. Skin Cancer  Check your skin from head to toe regularly.  Monitor any moles. Be sure to tell your health care provider:  About any new moles or changes in moles, especially if there is a change in a mole's shape or color.  If you have a mole that is larger than the size of a pencil eraser.  If any of your family members has a history of skin cancer, especially at a young age, talk with your health care provider about genetic screening.  Always use sunscreen. Apply sunscreen liberally and repeatedly throughout the day.  Whenever you are outside, protect yourself by wearing long sleeves, pants, a wide-brimmed hat, and sunglasses. WHAT SHOULD I KNOW ABOUT OSTEOPOROSIS? Osteoporosis is a condition in which bone destruction happens more quickly than new bone creation. After menopause, you may be at an increased risk for osteoporosis. To help prevent osteoporosis or the bone fractures that can happen because of osteoporosis, the following is recommended:  If you are 35-71 years old, get at least 1,000 mg of calcium and at least 600 mg of vitamin D per day.  If you are older than age 70 but younger than age 72, get at least 1,200 mg of calcium and at least 600  mg of vitamin D per day.  If you are older than age 16, get at least 1,200 mg of calcium and at least 800 mg of vitamin D per day. Smoking and excessive alcohol intake increase the risk of osteoporosis. Eat foods that are rich in calcium and vitamin D, and do weight-bearing exercises several times each week as directed by your health care provider. WHAT SHOULD I KNOW ABOUT HOW MENOPAUSE AFFECTS [place]? Depression may occur at any age, but it is more common as you become older. Common symptoms of depression include:  Low or sad mood.  Changes in sleep patterns.  Changes in appetite or eating patterns.  Feeling an overall lack of motivation or enjoyment of activities that you previously enjoyed.  Frequent crying spells. Talk with your health care provider if you think that you are experiencing depression. WHAT SHOULD I KNOW ABOUT IMMUNIZATIONS? It is important that you get and maintain your immunizations. These include:  Tetanus, diphtheria, and pertussis (Tdap) booster vaccine.  Influenza every year before the flu season begins.  Pneumonia vaccine.  Shingles vaccine. Your health care provider may also recommend other immunizations.   This information is not intended to replace advice given to you by your health care provider. Make sure you discuss any questions you have with your health care provider.   Document Released: 07/06/2005 Document Revised: 06/04/2014 Document Reviewed: 01/14/2014 Elsevier Interactive Patient Education [employer].

## 2015-05-31 NOTE — Progress Notes (Signed)
Patient ID: Elizabeth Hanna, female    DOB: 05-21-1956  Age: 60 y.o. MRN: [id number]  The patient is here for annual  wellness examination and management of other chronic and acute problems.   ASCUs,  HPV negative 2016  Screening labs normal, but Vit D was 22  Mammogram normal [place] 2015    The risk factors are reflected in the social history.  The roster of all physicians providing medical care to patient - is listed in the Snapshot section of the chart.  Home safety : The patient has smoke detectors in the home. They wear seatbelts.  There are no firearms at home. There is no violence in the home.   There is no risks for hepatitis, STDs or HIV. There is no   history of blood transfusion. They have no travel history to infectious disease endemic areas of the world.  The patient has seen their dentist in the last six month. They have seen their eye doctor in the last year. They admit to slight hearing difficulty with regard to whispered voices and some television programs.  They have deferred audiologic testing in the last year.  They do not  have excessive sun exposure. Discussed the need for sun protection: hats, long sleeves and use of sunscreen if there is significant sun exposure.   Diet: the importance of a healthy diet is discussed. They do have a healthy diet.  The benefits of regular aerobic exercise were discussed. She walks 4 times per week ,  20 minutes.   Depression screen: there are no signs or vegative symptoms of depression- irritability, change in appetite, anhedonia, sadness/tearfullness.   The following portions of the patient's history were reviewed and updated as appropriate: allergies, current medications, past family history, past medical history,  past surgical history, past social history  and problem list.  Visual acuity was not assessed per patient preference since she has regular follow up with her ophthalmologist. Hearing and body mass index were  assessed and reviewed.   During the course of the visit the patient was educated and counseled about appropriate screening and preventive services including : fall prevention , diabetes screening, nutrition counseling, colorectal cancer screening, and recommended immunizations.    CC: The primary encounter diagnosis was Vitamin D deficiency. 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  Weight gain despite rigorous exercise daily.twice daily.   Vaginal itching resolved with clobetasol,  Recurs occasionally toward the end of the week starts with an irritated area in the superior region of the  labia minora and spreads caudally resolves after 2-3 days of use of ointment     History Elizabeth Hanna has a past medical history of Hypertension.   She has past surgical history that includes Cesarean section (1981).   Her family history includes Heart disease in her father.She reports that she quit smoking about 16 years ago. Her smoking use included Cigarettes. She has never used smokeless tobacco. She reports that she drinks alcohol. She reports that she does not use illicit drugs.  Outpatient Prescriptions Prior to Visit  Medication Sig Dispense Refill  . metoprolol tartrate (LOPRESSOR) 25 MG tablet Take 1 tablet (25 mg total) by mouth daily. 30 tablet 6  . clobetasol ointment (TEMOVATE) 0.05 % Apply 1 application topically 2 (two) times daily. (Patient not taking: Reported on 05/31/2015) 30 g 0   No  facility-administered medications prior to visit.    Review of Systems   Patient denies headache, fevers, malaise, unintentional weight loss, skin rash, eye pain, sinus congestion and sinus pain, sore throat, dysphagia,  hemoptysis , cough, dyspnea, wheezing, chest pain, palpitations, orthopnea, edema, abdominal pain, nausea, melena,  diarrhea, constipation, flank pain, dysuria, hematuria, urinary  Frequency, nocturia, numbness, tingling, seizures,  Focal weakness, Loss of consciousness,  Tremor, insomnia, depression, anxiety, and suicidal ideation.      Objective:  BP 128/78 mmHg  Pulse 70  Temp(Src) 97.9 F (36.6 C) (Oral)  Resp 12  Ht 5\' 3"  (1.6 m)  Wt 149 lb 2 oz (67.643 kg)  BMI 26.42 kg/m2  SpO2 97%  Physical Exam  General Appearance:    Alert, cooperative, no distress, appears stated age  Head:    Normocephalic, without obvious abnormality, atraumatic  Eyes:    PERRL, conjunctiva/corneas clear, EOM's intact, fundi    benign, both eyes  Ears:    Normal TM's and external ear canals, both ears  Nose:   Nares normal, septum midline, mucosa normal, no drainage    or sinus tenderness  Throat:   Lips, mucosa, and tongue normal; teeth and gums normal  Neck:   Supple, symmetrical, trachea midline, no adenopathy;    thyroid:  no enlargement/tenderness/nodules; no carotid   bruit or JVD  Back:     Symmetric, no curvature, ROM normal, no CVA tenderness  Lungs:     Clear to auscultation bilaterally, respirations unlabored  Chest Wall:    No tenderness or deformity   Heart:    Regular rate and rhythm, S1 and S2 normal, no murmur, rub   or gallop  Breast Exam:    No tenderness, masses, or nipple abnormality  Abdomen:     Soft, non-tender, bowel sounds active all four quadrants,    no masses, no organomegaly  Genitalia:    Pelvic: cervix normal in appearance, external genitalia normal, no adnexal masses or tenderness, no cervical motion tenderness, rectovaginal septum normal, uterus normal size, shape, and consistency and vagina normal without discharge  Extremities:   Extremities normal, atraumatic, no cyanosis or edema  Pulses:   2+ and symmetric all extremities  Skin:   Skin color, texture, turgor normal, no rashes or lesions  Lymph nodes:   Cervical, supraclavicular, and axillary nodes normal  Neurologic:    CNII-XII intact, normal strength, sensation and reflexes    throughout    Assessment & Plan:   Problem List Items Addressed This Visit    Visit for preventive health examination    Annual comprehensive preventive exam was done as well as an evaluation and management of chronic conditions .  During the course of the visit the patient was educated and counseled about appropriate screening and preventive services including :  diabetes screening, lipid analysis, nutrition counseling, colorectal cancer screening, and recommended immunizations.  Printed recommendations for health maintenance screenings was given.       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 Cheatum start on ergocalciferol. I am also having her maintain her metoprolol tartrate and clobetasol ointment.  Meds ordered this encounter  Medications  . ergocalciferol (DRISDOL) 50000 units capsule    Sig: Take 1 capsule (50,000 Units total) by mouth once a week.    Dispense:  12 capsule    Refill:  0  . clobetasol ointment (TEMOVATE) 0.05 %    Sig: Apply 1 application topically 2 (two) times daily.    Dispense:  30 g    Refill:  1    Medications Discontinued During This Encounter  Medication Reason  . clobetasol ointment (TEMOVATE) 0.05 % Reorder    Follow-up: No Follow-up on file.   Sherlene ShamsULLO, Yaretzi Ernandez L, MD

## 2015-05-31 NOTE — Progress Notes (Signed)
Pre-visit discussion using our clinic review tool. No additional management support is needed unless otherwise documented below in the visit note.  

## 2015-05-31 NOTE — Assessment & Plan Note (Signed)
Recommended use of Drisdol weekly x 3 months

## 2015-05-31 NOTE — Assessment & Plan Note (Signed)
Annual comprehensive preventive exam was done as well as an evaluation and management of chronic conditions .  During the course of the visit the patient was educated and counseled about appropriate screening and preventive services including :  diabetes screening, lipid analysis, nutrition counseling, colorectal cancer screening, and recommended immunizations.  Printed recommendations for health maintenance screenings was given.   

## 2015-05-31 NOTE — Assessment & Plan Note (Signed)
Early, suggested by history adn rapid resolution with clobetasol

## 2015-06-03 LAB — CYTOLOGY - PAP

## 2015-06-07 ENCOUNTER — Encounter: Payer: Self-pay | Admitting: *Deleted

## 2015-10-03 ENCOUNTER — Encounter: Payer: Self-pay | Admitting: Physician Assistant

## 2015-10-03 ENCOUNTER — Ambulatory Visit: Payer: Self-pay | Admitting: Physician Assistant

## 2015-10-03 VITALS — BP 119/70 | HR 79 | Temp 97.8°F

## 2015-10-03 DIAGNOSIS — J018 Other acute sinusitis: Secondary | ICD-10-CM

## 2015-10-03 MED ORDER — FLUTICASONE PROPIONATE 50 MCG/ACT NA SUSP: 2.0000 | Freq: Every day | NASAL | Status: DC

## 2015-10-03 MED ORDER — AZITHROMYCIN 250 MG PO TABS: ORAL_TABLET | ORAL | Status: DC

## 2015-10-03 NOTE — Progress Notes (Signed)
S: C/o runny nose and congestion for 7 days, no fever, chills, cp/sob, v/d; mucus is green and thick, cough is sporadic, c/o of facial and dental pain.   Using otc meds:   O: PE: vitals wnl, nad,  perrl eomi, normocephalic, tms dull, nasal mucosa red and swollen, throat injected, neck supple no lymph, lungs c t a, cv rrr, neuro intact  A:  Acute sinusitis   P: zpack, flonase; drink fluids, continue regular meds , use otc meds of choice, return if not improving in 5 days, return earlier if worsening

## 2015-11-11 ENCOUNTER — Other Ambulatory Visit: Payer: Self-pay | Admitting: Physician Assistant

## 2015-11-14 ENCOUNTER — Other Ambulatory Visit: Payer: Self-pay | Admitting: Physician Assistant

## 2015-11-14 NOTE — Telephone Encounter (Signed)
Med refill approved 

## 2016-02-27 ENCOUNTER — Other Ambulatory Visit: Payer: Self-pay | Admitting: Internal Medicine

## 2016-02-27 DIAGNOSIS — Z1231 Encounter for screening mammogram for malignant neoplasm of breast: Secondary | ICD-10-CM

## 2016-03-14 ENCOUNTER — Ambulatory Visit
Admission: RE | Admit: 2016-03-14 | Discharge: 2016-03-14 | Disposition: A | Discharge status: Home / Self Care | Payer: 59 | Source: Ambulatory Visit | Attending: Internal Medicine | Admitting: Internal Medicine

## 2016-03-14 DIAGNOSIS — Z1231 Encounter for screening mammogram for malignant neoplasm of breast: Secondary | ICD-10-CM | POA: Diagnosis not present

## 2016-05-23 ENCOUNTER — Other Ambulatory Visit: Payer: Self-pay

## 2016-05-23 DIAGNOSIS — Z299 Encounter for prophylactic measures, unspecified: Secondary | ICD-10-CM

## 2016-05-23 NOTE — Progress Notes (Signed)
Patient came in to have blood drawn for testing.  Patient has an appointment coming up with Dr. Darrick Huntsmanullo and wanted to get her labs done before her appointment.

## 2016-05-24 LAB — CMP12+LP+TP+TSH+6AC+CBC/D/PLT
ALBUMIN: 4.5 g/dL (ref 3.6–4.8)
ALT: 18 IU/L (ref 0–32)
AST: 13 IU/L (ref 0–40)
Albumin/Globulin Ratio: 2.4 — ABNORMAL HIGH (ref 1.2–2.2)
Alkaline Phosphatase: 55 IU/L (ref 39–117)
BUN/Creatinine Ratio: 27 (ref 12–28)
BUN: 15 mg/dL (ref 8–27)
Basophils Absolute: 0 10*3/uL (ref 0.0–0.2)
Basos: 1 %
Bilirubin Total: 0.4 mg/dL (ref 0.0–1.2)
CALCIUM: 9.4 mg/dL (ref 8.7–10.3)
CHOLESTEROL TOTAL: 185 mg/dL (ref 100–199)
Chloride: 104 mmol/L (ref 96–106)
Chol/HDL Ratio: 2.8 ratio units (ref 0.0–4.4)
Creatinine, Ser: 0.55 mg/dL — ABNORMAL LOW (ref 0.57–1.00)
EOS (ABSOLUTE): 0.1 10*3/uL (ref 0.0–0.4)
Eos: 1 %
Estimated CHD Risk: <0.5 times avg. (ref 0.0–1.0)
FREE THYROXINE INDEX: 1.7 (ref 1.2–4.9)
GFR calc Af Amer: 117 mL/min/{1.73_m2} (ref >59)
GFR calc non Af Amer: 102 mL/min/{1.73_m2} (ref >59)
GGT: 13 IU/L (ref 0–60)
GLOBULIN, TOTAL: 1.9 g/dL (ref 1.5–4.5)
Glucose: 109 mg/dL — ABNORMAL HIGH (ref 65–99)
HDL: 65 mg/dL (ref >39)
Hematocrit: 43.9 % (ref 34.0–46.6)
Hemoglobin: 15.1 g/dL (ref 11.1–15.9)
IMMATURE GRANS (ABS): 0 10*3/uL (ref 0.0–0.1)
Immature Granulocytes: 0 %
Iron: 114 ug/dL (ref 27–139)
LDH: 158 IU/L (ref 119–226)
LDL Calculated: 105 mg/dL — ABNORMAL HIGH (ref 0–99)
LYMPHS ABS: 1.8 10*3/uL (ref 0.7–3.1)
LYMPHS: 35 %
MCH: 31.7 pg (ref 26.6–33.0)
MCHC: 34.4 g/dL (ref 31.5–35.7)
MCV: 92 fL (ref 79–97)
MONOS ABS: 0.3 10*3/uL (ref 0.1–0.9)
Monocytes: 7 %
NEUTROS ABS: 2.8 10*3/uL (ref 1.4–7.0)
NEUTROS PCT: 56 %
PHOSPHORUS: 2.9 mg/dL (ref 2.5–4.5)
POTASSIUM: 4.6 mmol/L (ref 3.5–5.2)
Platelets: 242 10*3/uL (ref 150–379)
RBC: 4.76 x10E6/uL (ref 3.77–5.28)
RDW: 12.8 % (ref 12.3–15.4)
Sodium: 143 mmol/L (ref 134–144)
T3 Uptake Ratio: 25 % (ref 24–39)
T4 TOTAL: 6.7 ug/dL (ref 4.5–12.0)
TRIGLYCERIDES: 74 mg/dL (ref 0–149)
TSH: 0.996 u[IU]/mL (ref 0.450–4.500)
Total Protein: 6.4 g/dL (ref 6.0–8.5)
Uric Acid: 4.9 mg/dL (ref 2.5–7.1)
VLDL Cholesterol Cal: 15 mg/dL (ref 5–40)
WBC: 5 10*3/uL (ref 3.4–10.8)

## 2016-05-24 LAB — VITAMIN D 25 HYDROXY (VIT D DEFICIENCY, FRACTURES): Vit D, 25-Hydroxy: 20.2 ng/mL — ABNORMAL LOW (ref 30.0–100.0)

## 2016-05-31 ENCOUNTER — Ambulatory Visit (INDEPENDENT_AMBULATORY_CARE_PROVIDER_SITE_OTHER): Payer: 59 | Admitting: Internal Medicine

## 2016-05-31 ENCOUNTER — Encounter: Payer: Self-pay | Admitting: Internal Medicine

## 2016-05-31 ENCOUNTER — Other Ambulatory Visit: Payer: Self-pay

## 2016-05-31 VITALS — BP 140/86 | HR 84 | Temp 98.2°F | Resp 16 | Ht 62.25 in | Wt 149.5 lb

## 2016-05-31 DIAGNOSIS — E663 Overweight: Secondary | ICD-10-CM

## 2016-05-31 DIAGNOSIS — I10 Essential (primary) hypertension: Secondary | ICD-10-CM

## 2016-05-31 DIAGNOSIS — E559 Vitamin D deficiency, unspecified: Secondary | ICD-10-CM

## 2016-05-31 DIAGNOSIS — Z0000 Encounter for general adult medical examination without abnormal findings: Secondary | ICD-10-CM

## 2016-05-31 DIAGNOSIS — Z299 Encounter for prophylactic measures, unspecified: Secondary | ICD-10-CM

## 2016-05-31 DIAGNOSIS — E785 Hyperlipidemia, unspecified: Secondary | ICD-10-CM | POA: Diagnosis not present

## 2016-05-31 DIAGNOSIS — R1013 Epigastric pain: Secondary | ICD-10-CM

## 2016-05-31 MED ORDER — ERGOCALCIFEROL 1.25 MG (50000 UT) PO CAPS: 50000.0000 [IU] | ORAL_CAPSULE | ORAL | 3 refills | Status: DC

## 2016-05-31 MED ORDER — METOPROLOL TARTRATE 25 MG PO TABS: 25.0000 mg | ORAL_TABLET | Freq: Every day | ORAL | 1 refills | Status: DC

## 2016-05-31 NOTE — Patient Instructions (Signed)
1) check on your history of receiving Tdap vaccine  2) Shingrx vaccine is highly recommended for prevention of shingles.  3) Have your liver and pancreatic enzymes checked ASAP 4) cologuard has been ordered and will be sent to your home address 5) check BP 5 times over the next month  Goal is 120/70 or less based on new guidelines from the [facility] 6) I'll calculate your risk of heart attack based on fasting lipid panel and let you know if statin therapy is advised  The  diet I discussed with you today is the 10 day Green Smoothie Cleansing /Detox Diet by Brooke DareJJ Smith . available on [employer] for around $10.  It does require a blender, (Vita Mix, a electric juicer,  Or a Nutribullet Rx).  This is not a low carb or a weight loss diet,  It is fundamentally a "cleansing" low fat diet that eliminates sugar, gluten, caffeine, alcohol and dairy for 10 days .  What you add back after the initial ten days is entirely up to  you!  You can expect to lose 5 to 10 lbs depending on how strict you are.   I found that  drinking 2 smoothies or juices  daily and keeping one chewable meal (but keep it simple, like baked fish and salad, rice or bok choy) kept me satisfied and kept me from straying  .  You snack primarily on fresh  fruit, egg whites and judicious quantities of nuts.  You can add a  vegetable based protein powder  to any smoothie made with almond milk (nothing with whey , since whey is dairy)  [employer] has a few but  the [employer] has the greatest  selection .  Using frozen fruits is much more convenient and cost effective. You can even find plenty of organic fruit in the frozen fruit section of [employer].  Just thaw what you need for the following day the night before in the refrigerator (to avoid jamming up your machine)   The organic vegan protein powder I tried  is called Vega" and I found it at [employer] .  It is sugar free. Tastes like crap.  My advice:  Dorna BloomChew your protein  (eat an egg or two in the am with  your smoothie or add soy yogurt for protein ) ,  Don't ruin the taste of your smoothies with protein powder unless you can find one you really love.

## 2016-05-31 NOTE — Progress Notes (Addendum)
Patient ID: Elizabeth MilesShelia C Hanna, female    DOB: 1955-10-23  Age: 61 y.o. MRN: [id number]  The patient is here for annual physical examination and management of other chronic and acute problems.    PAP normal Jan 2017 Mammogram oct 2017 Due for colonoscopy vs cologuard  The risk factors are reflected in the social history.  The roster of all physicians providing medical care to patient - is listed in the Snapshot section of the chart.  Home safety : The patient has smoke detectors in the home. They wear seatbelts.  There are no firearms at home. There is no violence in the home.   There is no risks for hepatitis, STDs or HIV. There is no   history of blood transfusion. They have no travel history to infectious disease endemic areas of the world.  The patient has seen their dentist in the last six month. They have seen their eye doctor in the last year.  They do not  have excessive sun exposure. Discussed the need for sun protection: hats, long sleeves and use of sunscreen if there is significant sun exposure. She has regular annual dermatology follow up   Diet: the importance of a healthy diet is discussed. They do have a healthy diet.  The benefits of regular aerobic exercise were discussed. She exercises vigorously 4 to 5 days per week, 60 mintues .   Depression screen: there are no signs or vegative symptoms of depression- irritability, change in appetite, anhedonia, sadness/tearfullness.  The following portions of the patient's history were reviewed and updated as appropriate: allergies, current medications, past family history, past medical history,  past surgical history, past social history  and problem list.  Visual acuity was not assessed per patient preference since she has regular follow up with her ophthalmologist. Hearing and body mass index were assessed and reviewed.   During the course of the visit the patient was educated and counseled about appropriate screening and  preventive services including : fall prevention , diabetes screening, nutrition counseling, colorectal cancer screening, and recommended immunizations.    CC: The primary encounter diagnosis was Visit for preventive health examination. Diagnoses of Hyperlipidemia LDL goal <100, Vitamin D deficiency, Essential hypertension, Overweight (BMI 25.0-29.9), and Epigastric pain were also pertinent to this visit.  1) recent episode of abdominal pain. patient reports 2 isolate episodes of sudden onset of mid epigastric pain accompanied by a feeling of distension.  Episodes lasted about an hour or 2, were not accompanied by nausea, shortness of breath, dark colored urine, or change in bowel habits and were not associated with recent ingestion of food or alcohol .   2) Hypertension: patient checks blood pressure twice weekly at home.  Readings have been for the most part<> 140/80 at rest . Patient is following a reduced salt diet most days and is taking medications as prescribed.  3) overweight: she remains very frustrated by her inability to lose weight despite exercising regularly .  Diet and exercise program reviewed in detail.   History Elizabeth Hanna has a past medical history of Hypertension.   She has a past surgical history that includes Cesarean section (1981).   Her family history includes Heart disease in her father.She reports that she quit smoking about 17 years ago. Her smoking use included Cigarettes. She has never used smokeless tobacco. She reports that she drinks alcohol. She reports that she does not use drugs.  Outpatient Medications Prior to Visit  Medication Sig Dispense Refill  . metoprolol tartrate (LOPRESSOR)  25 MG tablet TAKE ONE TABLET BY MOUTH ONCE DAILY 30 tablet 6  . azithromycin (ZITHROMAX Z-PAK) 250 MG tablet 2 pills today then 1 pill a day for 4 days 6 each 0  . clobetasol ointment (TEMOVATE) 0.05 % Apply 1 application topically 2 (two) times daily. 30 g 1  . ergocalciferol  (DRISDOL) 50000 units capsule Take 1 capsule (50,000 Units total) by mouth once a week. 12 capsule 0  . fluticasone (FLONASE) 50 MCG/ACT nasal spray Place 2 sprays into both nostrils daily. 16 g 6   No facility-administered medications prior to visit.     Review of Systems   Patient denies headache, fevers, malaise, unintentional weight loss, skin rash, eye pain, sinus congestion and sinus pain, sore throat, dysphagia,  hemoptysis , cough, dyspnea, wheezing, chest pain, palpitations, orthopnea, edema, abdominal pain, nausea, melena, diarrhea, constipation, flank pain, dysuria, hematuria, urinary  Frequency, nocturia, numbness, tingling, seizures,  Focal weakness, Loss of consciousness,  Tremor, insomnia, depression, anxiety, and suicidal ideation.      Objective:  BP 140/86   Pulse 84   Temp 98.2 F (36.8 C) (Oral)   Resp 16   Ht 5' 2.25" (1.581 m)   Wt 149 lb 8 oz (67.8 kg)   SpO2 96%   BMI 27.12 kg/m   Physical Exam   General appearance: alert, cooperative and appears stated age Head: Normocephalic, without obvious abnormality, atraumatic Eyes: conjunctivae/corneas clear. PERRL, EOM's intact. Fundi benign. Ears: normal TM's and external ear canals both ears Nose: Nares normal. Septum midline. Mucosa normal. No drainage or sinus tenderness. Throat: lips, mucosa, and tongue normal; teeth and gums normal Neck: no adenopathy, no carotid bruit, no JVD, supple, symmetrical, trachea midline and thyroid not enlarged, symmetric, no tenderness/mass/nodules Lungs: clear to auscultation bilaterally Breasts: normal appearance, no masses or tenderness Heart: regular rate and rhythm, S1, S2 normal, no murmur, click, rub or gallop Abdomen: soft, non-tender; bowel sounds normal; no masses,  no organomegaly Extremities: extremities normal, atraumatic, no cyanosis or edema Pulses: 2+ and symmetric Skin: Skin color, texture, turgor normal. No rashes or lesions Neurologic: Alert and oriented X  3, normal strength and tone. Normal symmetric reflexes. Normal coordination and gait.     Assessment & Plan:   Problem List Items Addressed This Visit    Abdominal pain    Intermittent, with 2 distinct episodes.  Repeat liver enzymes and lipase are normal, abdominal exam is  normal. Recommending trial of famotidine /ranitidine and abd ultrasound for next occurrence.       Essential hypertension    elevation noted today. Despite use of metoprolol. .  Patient has been asked to check bp at home and submit readings in 2 weeks.       Relevant Medications   metoprolol tartrate (LOPRESSOR) 25 MG tablet   Hyperlipidemia LDL goal <100    Using the Framingham risk calculator,  her 10 year risk of coronary artery disease is 9.6%.  The [employer] recommends starting patients with this level of risk  on low  intensity statin therapy to lower your risks of these events. will recommend trial of simvastatin .   Lab Results  Component Value Date   CHOL 185 05/23/2016   HDL 65 05/23/2016   LDLCALC 105 (H) 05/23/2016   TRIG 74 05/23/2016   CHOLHDL 2.8 05/23/2016  '      Relevant Medications   metoprolol tartrate (LOPRESSOR) 25 MG tablet   Overweight (BMI 25.0-29.9)    I have  addressed  BMI and recommended wt loss of 10% of body weight over the next 6 months using a low fat, low starch, high protein  fruit/vegetable based Mediterranean diet .  She is already participating in over 30 minutes of aerobic exercise a minimum of 5 days per week.        Visit for preventive health examination - Primary    Annual comprehensive preventive exam was done as well as an evaluation and management of chronic conditions .  During the course of the visit the patient was educated and counseled about appropriate screening and preventive services including :  diabetes screening, lipid analysis with projected  10 year  risk for CAD , nutrition counseling, breast, cervical and colorectal cancer  screening, and recommended immunizations.  Printed recommendations for health maintenance screenings was given      Vitamin D deficiency    Low at 20,  rx drisdol weekly x 3 months          I have discontinued Ms. Brightwell's clobetasol ointment, azithromycin, and fluticasone. I have also changed her metoprolol tartrate. Additionally, I am having her maintain her ergocalciferol.  Meds ordered this encounter  Medications  . ergocalciferol (DRISDOL) 50000 units capsule    Sig: Take 1 capsule (50,000 Units total) by mouth once a week.    Dispense:  4 capsule    Refill:  3  . metoprolol tartrate (LOPRESSOR) 25 MG tablet    Sig: Take 1 tablet (25 mg total) by mouth daily.    Dispense:  90 tablet    Refill:  1    Medications Discontinued During This Encounter  Medication Reason  . azithromycin (ZITHROMAX Z-PAK) 250 MG tablet Completed Course  . clobetasol ointment (TEMOVATE) 0.05 % Completed Course  . ergocalciferol (DRISDOL) 50000 units capsule Completed Course  . fluticasone (FLONASE) 50 MCG/ACT nasal spray Patient Discharge  . metoprolol tartrate (LOPRESSOR) 25 MG tablet Reorder    Follow-up: No Follow-up on file.   [name], MD

## 2016-05-31 NOTE — Progress Notes (Signed)
Patient came in to have blood drawn for testing per Dr. Tullo's orders. 

## 2016-05-31 NOTE — Progress Notes (Signed)
Pre-visit discussion using our clinic review tool. No additional management support is needed unless otherwise documented below in the visit note.  

## 2016-06-01 LAB — HEPATIC FUNCTION PANEL
ALK PHOS: 58 IU/L (ref 39–117)
ALT: 21 IU/L (ref 0–32)
AST: 15 IU/L (ref 0–40)
Albumin: 4.8 g/dL (ref 3.6–4.8)
Bilirubin Total: 0.3 mg/dL (ref 0.0–1.2)
Bilirubin, Direct: 0.09 mg/dL (ref 0.00–0.40)
Total Protein: 6.9 g/dL (ref 6.0–8.5)

## 2016-06-01 LAB — LIPASE: Lipase: 30 U/L (ref 14–72)

## 2016-06-02 ENCOUNTER — Telehealth: Payer: Self-pay | Admitting: Internal Medicine

## 2016-06-02 DIAGNOSIS — E663 Overweight: Secondary | ICD-10-CM | POA: Insufficient documentation

## 2016-06-02 DIAGNOSIS — R109 Unspecified abdominal pain: Secondary | ICD-10-CM | POA: Insufficient documentation

## 2016-06-02 DIAGNOSIS — E785 Hyperlipidemia, unspecified: Secondary | ICD-10-CM | POA: Insufficient documentation

## 2016-06-02 NOTE — Assessment & Plan Note (Signed)
Low at 20,  rx drisdol weekly x 3 months

## 2016-06-02 NOTE — Assessment & Plan Note (Signed)
elevation noted today. Despite use of metoprolol. .  Patient has been asked to check bp at home and submit readings in 2 weeks.

## 2016-06-02 NOTE — Assessment & Plan Note (Signed)
Intermittent, with 2 distinct episodes.  Repeat liver enzymes and lipase are normal, abdominal exam is  normal. Recommending trial of famotidine /ranitidine and abd ultrasound for next occurrence.

## 2016-06-02 NOTE — Telephone Encounter (Signed)
I calculated your risk of coronary artery disease  based on recent fasting lipid panel. Based on your fasting cholesterol and your concurrent history of hypertension ,  your 10 year risk of having some type of vascular  event (including heart attack and stroke ) is mildly elevated at 9.6%, meaning one of of every 10 women with the same medical statistics will have some kind of "event" in the next 10 years.    The [employer] recommends starting patients with this level of risk  on low  intensity statin therapy to lower your risks of these events.  Therefore I would recommend a trial of  simvastatin (generic for Zocor) to lower your risk of heart attacks and strokes.  If you are willing to do so,  Let me know and I will send a prescription to your pharmacy.  This medication will require repeat blood work about 6 weeks after you start it to make sure it is not affecting your liver.  If you decide not do, Please consider a trial of red Yeast Rice as a natural remedy.  The natural remedies for cholesterol have not been proven to reduce your risk for a heart attack.  Red Yeast Rice has not been proven either,  But it  does lower cholesterol, so if you want to try it , the dose is 600 mg twice daily in capsule form, available OTC.  It does require monitoring of liver enzymes,  Just like the statins,  So if you decide to start it,  I would like you to repeat labs in 6 weeks which would be a non fasting hepatic panel , and in 6 months  A fasting lipid and hepatic panel.     Regards,  Dr. Darrick Huntsmanullo

## 2016-06-02 NOTE — Assessment & Plan Note (Addendum)
Using the Framingham risk calculator,  her 10 year risk of coronary artery disease is 9.6%.  The [employer] recommends starting patients with this level of risk  on low  intensity statin therapy to lower your risks of these events. will recommend trial of simvastatin .   Lab Results  Component Value Date   CHOL 185 05/23/2016   HDL 65 05/23/2016   LDLCALC 105 (H) 05/23/2016   TRIG 74 05/23/2016   CHOLHDL 2.8 05/23/2016  '

## 2016-06-02 NOTE — Assessment & Plan Note (Signed)
Annual comprehensive preventive exam was done as well as an evaluation and management of chronic conditions .  During the course of the visit the patient was educated and counseled about appropriate screening and preventive services including :  diabetes screening, lipid analysis with projected  10 year  risk for CAD , nutrition counseling, breast, cervical and colorectal cancer screening, and recommended immunizations.  Printed recommendations for health maintenance screenings was given 

## 2016-06-02 NOTE — Assessment & Plan Note (Signed)
I have addressed  BMI and recommended wt loss of 10% of body weight over the next 6 months using a low fat, low starch, high protein  fruit/vegetable based Mediterranean diet .  She is already participating in over 30 minutes of aerobic exercise a minimum of 5 days per week.

## 2016-06-04 LAB — COLOGUARD: COLOGUARD: NEGATIVE

## 2016-06-04 NOTE — Telephone Encounter (Signed)
Letter has been sent

## 2016-06-11 ENCOUNTER — Telehealth: Payer: Self-pay

## 2016-06-11 ENCOUNTER — Encounter: Payer: Self-pay | Admitting: Internal Medicine

## 2016-06-11 NOTE — Telephone Encounter (Signed)
Cologuard: Negative, Results Abstracted Please advise

## 2016-06-11 NOTE — Telephone Encounter (Signed)
My Chart message sent

## 2016-06-12 ENCOUNTER — Telehealth: Payer: Self-pay | Admitting: Internal Medicine

## 2016-06-12 NOTE — Telephone Encounter (Signed)
My Chart message sent

## 2016-08-27 ENCOUNTER — Encounter: Payer: Self-pay | Admitting: Physician Assistant

## 2016-08-27 ENCOUNTER — Ambulatory Visit: Payer: Self-pay | Admitting: Physician Assistant

## 2016-08-27 VITALS — BP 121/69 | HR 85 | Temp 98.3°F

## 2016-08-27 DIAGNOSIS — J029 Acute pharyngitis, unspecified: Secondary | ICD-10-CM

## 2016-08-27 MED ORDER — AZITHROMYCIN 250 MG PO TABS: ORAL_TABLET | ORAL | 0 refills | Status: DC

## 2016-08-27 NOTE — Progress Notes (Signed)
S: C/o sore throat, runny nose and congestion for 3 -4days, + fever, chills on friday, none since; denies cp/sob, v/d; mucus was green this am but clear throughout the day, cough is sporadic,   Using otc meds:   O: PE: vitals wnl, nad,  perrl eomi, normocephalic, tms dull, nasal mucosa red and swollen, throat red, neck supple no lymph, lungs c t a, cv rrr, neuro intact, f  A:  Acute pharyngitis   P: drink fluids, continue regular meds , use otc meds of choice, return if not improving in 5 days, return earlier if worsening , zpack

## 2016-12-12 ENCOUNTER — Ambulatory Visit: Payer: Self-pay | Admitting: Physician Assistant

## 2016-12-12 ENCOUNTER — Encounter: Payer: Self-pay | Admitting: Physician Assistant

## 2016-12-12 VITALS — BP 120/79 | HR 72 | Temp 98.5°F | Resp 16

## 2016-12-12 DIAGNOSIS — I10 Essential (primary) hypertension: Secondary | ICD-10-CM

## 2016-12-12 MED ORDER — METOPROLOL TARTRATE 25 MG PO TABS: 25.0000 mg | ORAL_TABLET | Freq: Every day | ORAL | 3 refills | Status: DC

## 2016-12-12 NOTE — Progress Notes (Signed)
S: here for med refill, no complaints, is running out of bp meds, will be retiring at the end of the month and is feeling excited about this, no cp/sob/swelling in feet or ankles, saw her pcp in January for full physical  O: vitals wnl, nad, lungs c t a, cv rrr, no pedal edema noted  A: htn  P: refill on lopressor, f/u with pcp as needed, able to use the clinic until age 89 as she is retiring with full benefits

## 2017-03-15 ENCOUNTER — Other Ambulatory Visit: Payer: Self-pay | Admitting: Internal Medicine

## 2017-03-15 DIAGNOSIS — Z1231 Encounter for screening mammogram for malignant neoplasm of breast: Secondary | ICD-10-CM

## 2017-04-09 ENCOUNTER — Ambulatory Visit
Admission: RE | Admit: 2017-04-09 | Discharge: 2017-04-09 | Disposition: A | Discharge status: Home / Self Care | Payer: 59 | Source: Ambulatory Visit | Attending: Internal Medicine | Admitting: Internal Medicine

## 2017-04-09 DIAGNOSIS — Z1231 Encounter for screening mammogram for malignant neoplasm of breast: Secondary | ICD-10-CM | POA: Diagnosis not present

## 2017-08-27 ENCOUNTER — Ambulatory Visit: Payer: Self-pay | Admitting: Family Medicine

## 2017-08-27 VITALS — BP 158/86 | HR 116 | Temp 98.5°F | Resp 20

## 2017-08-27 DIAGNOSIS — R509 Fever, unspecified: Secondary | ICD-10-CM

## 2017-08-27 DIAGNOSIS — J329 Chronic sinusitis, unspecified: Secondary | ICD-10-CM

## 2017-08-27 DIAGNOSIS — R52 Pain, unspecified: Secondary | ICD-10-CM

## 2017-08-27 LAB — POCT INFLUENZA A/B
Influenza A, POC: NEGATIVE
Influenza B, POC: NEGATIVE

## 2017-08-27 MED ORDER — AMOXICILLIN-POT CLAVULANATE 875-125 MG PO TABS: 1.0000 | ORAL_TABLET | Freq: Two times a day (BID) | ORAL | 0 refills | Status: AC

## 2017-08-27 NOTE — Progress Notes (Signed)
Subjective: Congestion     Elizabeth Hanna is a 62 y.o. female who presents for evaluation of nasal congestion with purulent sputum, cough, and sore throat.  Patient reports that for the last 2 weeks she has had mild upper respiratory infection symptoms of nasal congestion and rhinorrhea.  Reports that this weekend and she feels like she developed a low-grade fever but never checked her temperature.  Reports this has resolved completely and not recurred.  Has also felt some fatigue, body aches, and nausea since this weekend and that she developed a productive cough with green sputum this weekend as well.  Patient's resting pulse today is 116.  Patient reports this is normal for her and her primary care provider is aware of this.  Patient's blood pressure is elevated today at 156/86.  Patient reports that she has not taken her blood pressure medication today.  Patient reports she has been taking care of her daughter this past week who has the flu. Treatment to date: NyQuil as needed.  Denies rash, vomiting, diarrhea, wheezing, shortness of breath, chest or back pain, ear pain, difficulty swallowing, confusion, dental pain, facial pressure, headache, or severe symptoms. History of smoking, asthma, COPD: Negative. History of recurrent sinus and/or lung infections: Negative. Medical history: Hypertension, takes metoprolol for this.  Patient denies any other medical history. Antibiotic use in the last 3 months: Negative.   Review of Systems Pertinent items noted in HPI and remainder of comprehensive ROS otherwise negative.     Objective:   Physical Exam General: Awake, alert, and oriented. No acute distress. Well developed, hydrated and nourished. Appears stated age. Nontoxic appearance.  Afebrile. HEENT:  PND noted.  Mild erythema to posterior oropharynx.  No edema or exudates of pharynx or tonsils. No erythema or bulging of TM.  Mild erythema/edema to nasal mucosa.  Bilateral maxillary sinus  tenderness.  Remainder of sinuses nontender. Supple neck without adenopathy. Cardiac: Heart rate and rhythm are normal. No murmurs, gallops, or rubs are auscultated. S1 and S2 are heard and are of normal intensity.  Respiratory: No signs of respiratory distress. Lungs clear. No tachypnea. Able to speak in full sentences without dyspnea. Nonlabored respirations.  Skin: Skin is warm, dry and intact. Appropriate color for ethnicity. No cyanosis noted.   Diagnostic Results: Flu test negative. Oxygen saturation: 97% on room air.  Assessment:    sinusitis   Plan:    Discussed the diagnosis and treatment of sinusitis. Discussed the importance of avoiding unnecessary antibiotic therapy. Suggested symptomatic OTC remedies. Nasal saline spray for congestion.   Delayed antibiotic prescription for Augmentin provided.  Patient given explicit instructions regarding indications to fill this.  Not recommending filling this at this time due to the mild nature of the patient's symptoms.  Discussed side/adverse effects of this medication. Advised patient to monitor her blood pressure at home and report any abnormal findings to her primary care provider.  Discussed normal blood pressure findings.  Advised her to take her medication as prescribed.  Advised her to also discuss her elevated resting heart rate with her primary care provider. Follow-up with primary care provider. Discussed red flag symptoms and circumstances with which to seek medical care.

## 2017-09-02 ENCOUNTER — Ambulatory Visit: Payer: Self-pay

## 2017-12-09 ENCOUNTER — Other Ambulatory Visit: Payer: Self-pay

## 2017-12-09 NOTE — Progress Notes (Signed)
Pt didn't bring lab order. No labs done.

## 2017-12-12 ENCOUNTER — Ambulatory Visit (INDEPENDENT_AMBULATORY_CARE_PROVIDER_SITE_OTHER): Payer: Managed Care, Other (non HMO) | Admitting: Internal Medicine

## 2017-12-12 ENCOUNTER — Encounter: Payer: Self-pay | Admitting: Internal Medicine

## 2017-12-12 VITALS — BP 130/76 | HR 83 | Temp 97.9°F | Resp 14 | Ht 62.25 in | Wt 151.1 lb

## 2017-12-12 DIAGNOSIS — Z1231 Encounter for screening mammogram for malignant neoplasm of breast: Secondary | ICD-10-CM

## 2017-12-12 DIAGNOSIS — Z113 Encounter for screening for infections with a predominantly sexual mode of transmission: Secondary | ICD-10-CM | POA: Diagnosis not present

## 2017-12-12 DIAGNOSIS — M25562 Pain in left knee: Secondary | ICD-10-CM | POA: Insufficient documentation

## 2017-12-12 DIAGNOSIS — Z0000 Encounter for general adult medical examination without abnormal findings: Secondary | ICD-10-CM

## 2017-12-12 DIAGNOSIS — G8929 Other chronic pain: Secondary | ICD-10-CM | POA: Diagnosis not present

## 2017-12-12 DIAGNOSIS — R7303 Prediabetes: Secondary | ICD-10-CM

## 2017-12-12 DIAGNOSIS — E785 Hyperlipidemia, unspecified: Secondary | ICD-10-CM

## 2017-12-12 DIAGNOSIS — I10 Essential (primary) hypertension: Secondary | ICD-10-CM

## 2017-12-12 DIAGNOSIS — Z1239 Encounter for other screening for malignant neoplasm of breast: Secondary | ICD-10-CM

## 2017-12-12 MED ORDER — ZOSTER VAC RECOMB ADJUVANTED 50 MCG/0.5ML IM SUSR: 0.5000 mL | Freq: Once | INTRAMUSCULAR | 1 refills | Status: DC

## 2017-12-12 MED ORDER — CLOBETASOL PROPIONATE 0.05 % EX OINT: 1.0000 "application " | TOPICAL_OINTMENT | Freq: Two times a day (BID) | CUTANEOUS | 4 refills | Status: DC

## 2017-12-12 MED ORDER — METOPROLOL TARTRATE 25 MG PO TABS: 25.0000 mg | ORAL_TABLET | Freq: Every day | ORAL | 3 refills | Status: DC

## 2017-12-12 NOTE — Progress Notes (Signed)
Patient ID: Elizabeth Hanna, female    DOB: [birth year]  Age: 62 y.o. MRN: [id number]  The patient is here for  preventive examination and management of other chronic and acute problems.   cologuard negative 2018 Mammogram nov 2018 Normal PAP smear 2017 Dermatology: Saw Dasher , removed skin lesion left temple   The risk factors are reflected in the social history.  The roster of all physicians providing medical care to patient - is listed in the Snapshot section of the chart.  Activities of daily living:  The patient is 100% independent in all ADLs: dressing, toileting, feeding as well as independent mobility  Home safety : The patient has smoke detectors in the home. They wear seatbelts.  There are no firearms at home. There is no violence in the home.   There is no risks for hepatitis, STDs or HIV. There is no   history of blood transfusion. They have no travel history to infectious disease endemic areas of the world.  The patient has seen their dentist in the last six month. They have seen their eye doctor in the last year. They deny  hearing difficulty with regard to whispered voices and some television programs.  They have deferred audiologic testing in the last year.  They do not  have excessive sun exposure. Discussed the need for sun protection: hats, long sleeves and use of sunscreen if there is significant sun exposure.   Diet: the importance of a healthy diet is discussed. They do have a healthy diet.  The benefits of regular aerobic exercise were discussed. She walks 4 times per week ,  20 minutes.   Depression screen: there are no signs or vegative symptoms of depression- irritability, change in appetite, anhedonia, sadness/tearfullness.  Cognitive assessment: the patient manages all their financial and personal affairs and is actively engaged. They could relate day,date,year and events; recalled 2/3 objects at 3 minutes; performed clock-face test normally.  The following  portions of the patient's history were reviewed and updated as appropriate: allergies, current medications, past family history, past medical history,  past surgical history, past social history  and problem list.  Visual acuity was not assessed per patient preference since she has regular follow up with her ophthalmologist. Hearing and body mass index were assessed and reviewed.   During the course of the visit the patient was educated and counseled about appropriate screening and preventive services including : fall prevention , diabetes screening, nutrition counseling, colorectal cancer screening, and recommended immunizations.    CC: The primary encounter diagnosis was Screen for STD (sexually transmitted disease). Diagnoses of Chronic pain of left knee, Hyperlipidemia LDL goal <130, Encounter for preventive health examination, Breast cancer screening, Essential hypertension, Hyperlipidemia LDL goal <100, and Visit for preventive health examination were also pertinent to this visit.  Patient is taking her medications as prescribed and notes no adverse effects.  Home BP readings have been done about once per week and are  generally < 130/80 .  She is avoiding added salt in her diet and walking regularly about 3 times per week for exercise  .  History Elizabeth Hanna has a past medical history of Hypertension.   She has a past surgical history that includes Cesarean section (1981).   Her family history includes Heart disease in her father.She reports that she quit smoking about 19 years ago. Her smoking use included cigarettes. She has never used smokeless tobacco. She reports that she drinks alcohol. She reports that she does not use  drugs.  Outpatient Medications Prior to Visit  Medication Sig Dispense Refill  . Clobetasol Prop Emollient Base (CLOBETASOL PROPIONATE E) 0.05 % emollient cream Apply topically 2 (two) times daily.    . metoprolol tartrate (LOPRESSOR) 25 MG tablet Take 1 tablet (25 mg  total) by mouth daily. 90 tablet 3   No facility-administered medications prior to visit.     Review of Systems   Patient denies headache, fevers, malaise, unintentional weight loss, skin rash, eye pain, sinus congestion and sinus pain, sore throat, dysphagia,  hemoptysis , cough, dyspnea, wheezing, chest pain, palpitations, orthopnea, edema, abdominal pain, nausea, melena, diarrhea, constipation, flank pain, dysuria, hematuria, urinary  Frequency, nocturia, numbness, tingling, seizures,  Focal weakness, Loss of consciousness,  Tremor, insomnia, depression, anxiety, and suicidal ideation.      Objective:  BP 130/76 (BP Location: Left Arm, Patient Position: Sitting, Cuff Size: Normal)   Pulse 83   Temp 97.9 F (36.6 C) (Oral)   Resp 14   Ht 5' 2.25" (1.581 m)   Wt 151 lb 1.9 oz (68.5 kg)   SpO2 96%   BMI 27.42 kg/m   Physical Exam   General appearance: alert, cooperative and appears stated age Head: Normocephalic, without obvious abnormality, atraumatic Eyes: conjunctivae/corneas clear. PERRL, EOM's intact. Fundi benign. Ears: normal TM's and external ear canals both ears Nose: Nares normal. Septum midline. Mucosa normal. No drainage or sinus tenderness. Throat: lips, mucosa, and tongue normal; teeth and gums normal Neck: no adenopathy, no carotid bruit, no JVD, supple, symmetrical, trachea midline and thyroid not enlarged, symmetric, no tenderness/mass/nodules Lungs: clear to auscultation bilaterally Breasts: normal appearance, no masses or tenderness Heart: regular rate and rhythm, S1, S2 normal, no murmur, click, rub or gallop Abdomen: soft, non-tender; bowel sounds normal; no masses,  no organomegaly Extremities: extremities normal, atraumatic, no cyanosis or edema Pulses: 2+ and symmetric Skin: Skin color, texture, turgor normal. No rashes or lesions Neurologic: Alert and oriented X 3, normal strength and tone. Normal symmetric reflexes. Normal coordination and gait.       Assessment & Plan:   Problem List Items Addressed This Visit    Visit for preventive health examination    age appropriate education and counseling updated, referrals for preventative services and immunizations addressed, dietary and smoking counseling addressed, most recent labs reviewed.  I have personally reviewed and have noted:  1) the patient's medical and social history 2) The pt's use of alcohol, tobacco, and illicit drugs 3) The patient's current medications and supplements 4) Functional ability including ADL's, fall risk, home safety risk, hearing and visual impairment 5) Diet and physical activities 6) Evidence for depression or mood disorder 7) The patient's height, weight, and BMI have been recorded in the chart  I have made referrals, and provided counseling and education based on review of the above      Knee pain, left    Started after doing  "jump class" at the gym  Feels lie it locked up at time,    Stopped going to the gyn for 6 months and now walking daily .        Hyperlipidemia LDL goal <100    Using the Framingham risk calculator,  her 10 year risk of coronary artery disease is 8%  No therapy indicated.  T  Lab Results  Component Value Date   CHOL 189 12/13/2017   HDL 73 12/13/2017   LDLCALC 105 (H) 12/13/2017   TRIG 53 12/13/2017   CHOLHDL 2.6 12/13/2017  '  Relevant Medications   metoprolol tartrate (LOPRESSOR) 25 MG tablet   Essential hypertension    Well controlled on current regimen. Renal function due, no changes today. Lab Results  Component Value Date   CREATININE 0.56 (L) 12/13/2017   Lab Results  Component Value Date   NA 137 12/13/2017   K 5.1 12/13/2017   CL 100 12/13/2017   CO2 23 12/13/2017          Relevant Medications   metoprolol tartrate (LOPRESSOR) 25 MG tablet    Other Visit Diagnoses    Screen for STD (sexually transmitted disease)    -  Primary   Hyperlipidemia LDL goal <130       Relevant  Medications   metoprolol tartrate (LOPRESSOR) 25 MG tablet   Encounter for preventive health examination       Relevant Orders   Lipid panel (Completed)   Comprehensive metabolic panel (Completed)   VITAMIN D 25 Hydroxy (Vit-D Deficiency, Fractures) (Completed)   CBC with Differential/Platelet (Completed)   Hemoglobin A1c (Completed)   TSH (Completed)   HIV antibody (Completed)   Breast cancer screening       Relevant Orders   MM 3D SCREEN BREAST BILATERAL      I am having Elizabeth Hanna start on Zoster Vaccine Adjuvanted. I am also having her maintain her Clobetasol Prop Emollient Base, clobetasol ointment, and metoprolol tartrate.  Meds ordered this encounter  Medications  . clobetasol ointment (TEMOVATE) 0.05 %    Sig: Apply 1 application topically 2 (two) times daily.    Dispense:  60 g    Refill:  4  . Zoster Vaccine Adjuvanted [facility]SHINGRIX) injection    Sig: Inject 0.5 mLs into the muscle once for 1 dose.    Dispense:  1 each    Refill:  1  . metoprolol tartrate (LOPRESSOR) 25 MG tablet    Sig: Take 1 tablet (25 mg total) by mouth daily.    Dispense:  90 tablet    Refill:  3    Medications Discontinued During This Encounter  Medication Reason  . metoprolol tartrate (LOPRESSOR) 25 MG tablet Reorder    Follow-up: Return in about 1 year (around 12/13/2018).   Sherlene Shamseresa L Tullo, MD

## 2017-12-12 NOTE — Assessment & Plan Note (Signed)
Started after doing  "jump class" at the gym  Feels lie it locked up at time,    Stopped going to the gyn for 6 months and now walking daily .

## 2017-12-12 NOTE — Patient Instructions (Addendum)
GOOD TO SEE YOU!!   The new goals for optimal blood pressure management are 120/70.  Please check your blood pressure a few times at home  Or at [facility] over the next month ,  and send me the readings so I can determine if you need a change in medication .   The ShingRx vaccine is now available in local pharmacies and is much more protective thant Zostavaxs,  It is therefore ADVISED for all interested adults over 50 to prevent shingles   Your mammogram is due in November  And has been ordered. You will need to schedule it   Health Maintenance for Postmenopausal Women Menopause is a normal process in which your reproductive ability comes to an end. This process happens gradually over a span of months to years, usually between the ages of 87 and 71. Menopause is complete when you have missed 12 consecutive menstrual periods. It is important to talk with your health care provider about some of the most common conditions that affect postmenopausal women, such as heart disease, cancer, and bone loss (osteoporosis). Adopting a healthy lifestyle and getting preventive care can help to promote your health and wellness. Those actions can also lower your chances of developing some of these common conditions. What should I know about menopause? During menopause, you may experience a number of symptoms, such as:  Moderate-to-severe hot flashes.  Night sweats.  Decrease in sex drive.  Mood swings.  Headaches.  Tiredness.  Irritability.  Memory problems.  Insomnia.  Choosing to treat or not to treat menopausal changes is an individual decision that you make with your health care provider. What should I know about hormone replacement therapy and supplements? Hormone therapy products are effective for treating symptoms that are associated with menopause, such as hot flashes and night sweats. Hormone replacement carries certain risks, especially as you become older. If you are thinking about  using estrogen or estrogen with progestin treatments, discuss the benefits and risks with your health care provider. What should I know about heart disease and stroke? Heart disease, heart attack, and stroke become more likely as you age. This may be due, in part, to the hormonal changes that your body experiences during menopause. These can affect how your body processes dietary fats, triglycerides, and cholesterol. Heart attack and stroke are both medical emergencies. There are many things that you can do to help prevent heart disease and stroke:  Have your blood pressure checked at least every 1-2 years. High blood pressure causes heart disease and increases the risk of stroke.  If you are 34-73 years old, ask your health care provider if you should take aspirin to prevent a heart attack or a stroke.  Do not use any tobacco products, including cigarettes, chewing tobacco, or electronic cigarettes. If you need help quitting, ask your health care provider.  It is important to eat a healthy diet and maintain a healthy weight. ? Be sure to include plenty of vegetables, fruits, low-fat dairy products, and lean protein. ? Avoid eating foods that are high in solid fats, added sugars, or salt (sodium).  Get regular exercise. This is one of the most important things that you can do for your health. ? Try to exercise for at least 150 minutes each week. The type of exercise that you do should increase your heart rate and make you sweat. This is known as moderate-intensity exercise. ? Try to do strengthening exercises at least twice each week. Do these in addition  to the moderate-intensity exercise.  Know your numbers.Ask your health care provider to check your cholesterol and your blood glucose. Continue to have your blood tested as directed by your health care provider.  What should I know about cancer screening? There are several types of cancer. Take the following steps to reduce your risk and to  catch any cancer development as early as possible. Breast Cancer  Practice breast self-awareness. ? This means understanding how your breasts normally appear and feel. ? It also means doing regular breast self-exams. Let your health care provider know about any changes, no matter how small.  If you are 43 or older, have a clinician do a breast exam (clinical breast exam or CBE) every year. Depending on your age, family history, and medical history, it may be recommended that you also have a yearly breast X-ray (mammogram).  If you have a family history of breast cancer, talk with your health care provider about genetic screening.  If you are at high risk for breast cancer, talk with your health care provider about having an MRI and a mammogram every year.  Breast cancer (BRCA) gene test is recommended for women who have family members with BRCA-related cancers. Results of the assessment will determine the need for genetic counseling and BRCA1 and for BRCA2 testing. BRCA-related cancers include these types: ? Breast. This occurs in males or females. ? Ovarian. ? Tubal. This may also be called fallopian tube cancer. ? Cancer of the abdominal or pelvic lining (peritoneal cancer). ? Prostate. ? Pancreatic.  Cervical, Uterine, and Ovarian Cancer Your health care provider may recommend that you be screened regularly for cancer of the pelvic organs. These include your ovaries, uterus, and vagina. This screening involves a pelvic exam, which includes checking for microscopic changes to the surface of your cervix (Pap test).  For women ages 21-65, health care providers may recommend a pelvic exam and a Pap test every three years. For women ages 7-65, they may recommend the Pap test and pelvic exam, combined with testing for human papilloma virus (HPV), every five years. Some types of HPV increase your risk of cervical cancer. Testing for HPV may also be done on women of any age who have unclear Pap  test results.  Other health care providers may not recommend any screening for nonpregnant women who are considered low risk for pelvic cancer and have no symptoms. Ask your health care provider if a screening pelvic exam is right for you.  If you have had past treatment for cervical cancer or a condition that could lead to cancer, you need Pap tests and screening for cancer for at least 20 years after your treatment. If Pap tests have been discontinued for you, your risk factors (such as having a new sexual partner) need to be reassessed to determine if you should start having screenings again. Some women have medical problems that increase the chance of getting cervical cancer. In these cases, your health care provider may recommend that you have screening and Pap tests more often.  If you have a family history of uterine cancer or ovarian cancer, talk with your health care provider about genetic screening.  If you have vaginal bleeding after reaching menopause, tell your health care provider.  There are currently no reliable tests available to screen for ovarian cancer.  Lung Cancer Lung cancer screening is recommended for adults 32-60 years old who are at high risk for lung cancer because of a history of smoking. A yearly  low-dose CT scan of the lungs is recommended if you:  Currently smoke.  Have a history of at least 30 pack-years of smoking and you currently smoke or have quit within the past 15 years. A pack-year is smoking an average of one pack of cigarettes per day for one year.  Yearly screening should:  Continue until it has been 15 years since you quit.  Stop if you develop a health problem that would prevent you from having lung cancer treatment.  Colorectal Cancer  This type of cancer can be detected and can often be prevented.  Routine colorectal cancer screening usually begins at age 37 and continues through age 29.  If you have risk factors for colon cancer, your  health care provider may recommend that you be screened at an earlier age.  If you have a family history of colorectal cancer, talk with your health care provider about genetic screening.  Your health care provider may also recommend using home test kits to check for hidden blood in your stool.  A small camera at the end of a tube can be used to examine your colon directly (sigmoidoscopy or colonoscopy). This is done to check for the earliest forms of colorectal cancer.  Direct examination of the colon should be repeated every 5-10 years until age 20. However, if early forms of precancerous polyps or small growths are found or if you have a family history or genetic risk for colorectal cancer, you may need to be screened more often.  Skin Cancer  Check your skin from head to toe regularly.  Monitor any moles. Be sure to tell your health care provider: ? About any new moles or changes in moles, especially if there is a change in a mole's shape or color. ? If you have a mole that is larger than the size of a pencil eraser.  If any of your family members has a history of skin cancer, especially at a young age, talk with your health care provider about genetic screening.  Always use sunscreen. Apply sunscreen liberally and repeatedly throughout the day.  Whenever you are outside, protect yourself by wearing long sleeves, pants, a wide-brimmed hat, and sunglasses.  What should I know about osteoporosis? Osteoporosis is a condition in which bone destruction happens more quickly than new bone creation. After menopause, you may be at an increased risk for osteoporosis. To help prevent osteoporosis or the bone fractures that can happen because of osteoporosis, the following is recommended:  If you are 53-60 years old, get at least 1,000 mg of calcium and at least 600 mg of vitamin D per day.  If you are older than age 33 but younger than age 36, get at least 1,200 mg of calcium and at least 600  mg of vitamin D per day.  If you are older than age 17, get at least 1,200 mg of calcium and at least 800 mg of vitamin D per day.  Smoking and excessive alcohol intake increase the risk of osteoporosis. Eat foods that are rich in calcium and vitamin D, and do weight-bearing exercises several times each week as directed by your health care provider. What should I know about how menopause affects my mental health? Depression may occur at any age, but it is more common as you become older. Common symptoms of depression include:  Low or sad mood.  Changes in sleep patterns.  Changes in appetite or eating patterns.  Feeling an overall lack of motivation or enjoyment  of activities that you previously enjoyed.  Frequent crying spells.  Talk with your health care provider if you think that you are experiencing depression. What should I know about immunizations? It is important that you get and maintain your immunizations. These include:  Tetanus, diphtheria, and pertussis (Tdap) booster vaccine.  Influenza every year before the flu season begins.  Pneumonia vaccine.  Shingles vaccine.  Your health care provider may also recommend other immunizations. This information is not intended to replace advice given to you by your health care provider. Make sure you discuss any questions you have with your health care provider. Document Released: 07/06/2005 Document Revised: 12/02/2015 Document Reviewed: 02/15/2015 Elsevier Interactive Patient Education  2018 [employer].

## 2017-12-13 ENCOUNTER — Other Ambulatory Visit: Payer: Self-pay

## 2017-12-14 DIAGNOSIS — R7303 Prediabetes: Secondary | ICD-10-CM | POA: Insufficient documentation

## 2017-12-14 LAB — COMPREHENSIVE METABOLIC PANEL
ALBUMIN: 4.6 g/dL (ref 3.6–4.8)
ALK PHOS: 56 IU/L (ref 39–117)
ALT: 20 IU/L (ref 0–32)
AST: 18 IU/L (ref 0–40)
Albumin/Globulin Ratio: 2.4 — ABNORMAL HIGH (ref 1.2–2.2)
BILIRUBIN TOTAL: 0.5 mg/dL (ref 0.0–1.2)
BUN/Creatinine Ratio: 25 (ref 12–28)
BUN: 14 mg/dL (ref 8–27)
CO2: 23 mmol/L (ref 20–29)
CREATININE: 0.56 mg/dL — AB (ref 0.57–1.00)
Calcium: 9.5 mg/dL (ref 8.7–10.3)
Chloride: 100 mmol/L (ref 96–106)
GFR, EST AFRICAN AMERICAN: 115 mL/min/{1.73_m2} (ref >59)
GFR, EST NON AFRICAN AMERICAN: 100 mL/min/{1.73_m2} (ref >59)
Globulin, Total: 1.9 g/dL (ref 1.5–4.5)
Glucose: 107 mg/dL — ABNORMAL HIGH (ref 65–99)
Potassium: 5.1 mmol/L (ref 3.5–5.2)
Sodium: 137 mmol/L (ref 134–144)
Total Protein: 6.5 g/dL (ref 6.0–8.5)

## 2017-12-14 LAB — LIPID PANEL
Chol/HDL Ratio: 2.6 ratio (ref 0.0–4.4)
Cholesterol, Total: 189 mg/dL (ref 100–199)
HDL: 73 mg/dL (ref >39)
LDL CALC: 105 mg/dL — AB (ref 0–99)
Triglycerides: 53 mg/dL (ref 0–149)
VLDL CHOLESTEROL CAL: 11 mg/dL (ref 5–40)

## 2017-12-14 LAB — CBC WITH DIFFERENTIAL/PLATELET
BASOS: 1 %
Basophils Absolute: 0 10*3/uL (ref 0.0–0.2)
EOS (ABSOLUTE): 0.1 10*3/uL (ref 0.0–0.4)
EOS: 1 %
Hematocrit: 42.4 % (ref 34.0–46.6)
Hemoglobin: 14.2 g/dL (ref 11.1–15.9)
IMMATURE GRANS (ABS): 0 10*3/uL (ref 0.0–0.1)
IMMATURE GRANULOCYTES: 0 %
LYMPHS: 34 %
Lymphocytes Absolute: 2.1 10*3/uL (ref 0.7–3.1)
MCH: 30.7 pg (ref 26.6–33.0)
MCHC: 33.5 g/dL (ref 31.5–35.7)
MCV: 92 fL (ref 79–97)
Monocytes Absolute: 0.4 10*3/uL (ref 0.1–0.9)
Monocytes: 7 %
NEUTROS PCT: 57 %
Neutrophils Absolute: 3.6 10*3/uL (ref 1.4–7.0)
PLATELETS: 250 10*3/uL (ref 150–450)
RBC: 4.62 x10E6/uL (ref 3.77–5.28)
RDW: 13 % (ref 12.3–15.4)
WBC: 6.1 10*3/uL (ref 3.4–10.8)

## 2017-12-14 LAB — HEMOGLOBIN A1C
ESTIMATED AVERAGE GLUCOSE: 123 mg/dL
HEMOGLOBIN A1C: 5.9 % — AB (ref 4.8–5.6)

## 2017-12-14 LAB — VITAMIN D 25 HYDROXY (VIT D DEFICIENCY, FRACTURES): Vit D, 25-Hydroxy: 23 ng/mL — ABNORMAL LOW (ref 30.0–100.0)

## 2017-12-14 LAB — HIV ANTIBODY (ROUTINE TESTING W REFLEX): HIV SCREEN 4TH GENERATION: NONREACTIVE

## 2017-12-14 LAB — SPECIMEN STATUS REPORT

## 2017-12-14 LAB — TSH: TSH: 1.07 u[IU]/mL (ref 0.450–4.500)

## 2017-12-14 MED ORDER — ERGOCALCIFEROL 1.25 MG (50000 UT) PO CAPS: 50000.0000 [IU] | ORAL_CAPSULE | ORAL | 0 refills | Status: DC

## 2017-12-14 NOTE — Assessment & Plan Note (Signed)
Her  random glucose is not  elevated but her A1c suggests she is at risk for developing diabetes.  I recommend he follow a low glycemic index diet and particpate regularly in an aerobic  exercise activity.  We should check an A1c in 6 months.   

## 2017-12-14 NOTE — Assessment & Plan Note (Signed)
Using the Framingham risk calculator,  her 10 year risk of coronary artery disease is 8%  No therapy indicated.  T  Lab Results  Component Value Date   CHOL 189 12/13/2017   HDL 73 12/13/2017   LDLCALC 105 (H) 12/13/2017   TRIG 53 12/13/2017   CHOLHDL 2.6 12/13/2017  '

## 2017-12-14 NOTE — Assessment & Plan Note (Addendum)
Well controlled on current regimen. Renal function due, no changes today. Lab Results  Component Value Date   CREATININE 0.56 (L) 12/13/2017   Lab Results  Component Value Date   NA 137 12/13/2017   K 5.1 12/13/2017   CL 100 12/13/2017   CO2 23 12/13/2017

## 2017-12-14 NOTE — Assessment & Plan Note (Signed)

## 2017-12-30 ENCOUNTER — Telehealth: Payer: Self-pay

## 2017-12-30 NOTE — Telephone Encounter (Signed)
Copied from CRM [phone]. Topic: General - Other >> Dec 30, 2017  4:30 PM Marylen PontoMcneil, Ja-Kwan wrote: Reason for CRM: Pt states she received a letter from her insurance company denying the labs because it states it was not medically necessary. Pt request a return call. Cb# [phone]

## 2017-12-31 NOTE — Telephone Encounter (Signed)
Patient states that her insurance will not pay for the vitamin D lab due to the code not being applicable. How can this be handled she states that she called her insurance company because they have paid for it before but will not this time due to the diagnosis code being used.

## 2017-12-31 NOTE — Telephone Encounter (Signed)
What labs does her insurance company say wasn't medically necessary ?

## 2018-01-28 ENCOUNTER — Encounter: Payer: Self-pay | Admitting: Internal Medicine

## 2018-04-10 ENCOUNTER — Ambulatory Visit
Admission: RE | Admit: 2018-04-10 | Discharge: 2018-04-10 | Disposition: A | Discharge status: Home / Self Care | Payer: Managed Care, Other (non HMO) | Source: Ambulatory Visit | Attending: Internal Medicine | Admitting: Internal Medicine

## 2018-04-10 DIAGNOSIS — Z1239 Encounter for other screening for malignant neoplasm of breast: Secondary | ICD-10-CM | POA: Diagnosis not present

## 2018-05-01 ENCOUNTER — Ambulatory Visit: Payer: Self-pay | Admitting: Emergency Medicine

## 2018-05-01 ENCOUNTER — Encounter: Payer: Self-pay | Admitting: Emergency Medicine

## 2018-05-01 VITALS — BP 142/84 | HR 68 | Temp 98.2°F | Resp 14

## 2018-05-01 DIAGNOSIS — J040 Acute laryngitis: Secondary | ICD-10-CM

## 2018-05-01 DIAGNOSIS — J209 Acute bronchitis, unspecified: Secondary | ICD-10-CM

## 2018-05-01 NOTE — Progress Notes (Signed)
[facility]   Patient ID: Elizabeth MilesShelia C Hanna DOB: 62 y.o. MRN: [id number]   Subjective: 4 days ago, had low-grade fever sinus congestion, progressed to hoarseness, now cough productive of yellow sputum.  Fever resolved, no fever for 2 days.  Her energy level is okay.  No chills or wheezing or shortness of breath.  She actually feels a little better this morning.  Denies earache.  Has minimal sinus congestion without discolored rhinorrhea.    Objective: She is hoarse but no distress.  TMs normal.  Nose boggy turbinates, minimal congestion.  No drainage.  Pharynx clear.  No redness or tonsillar enlargement or exudate. Neck: Supple no adenopathy Lungs clear to auscultation.  No wheezes or rales.  Breath sounds equal bilaterally. Heart: Regular rate and rhythm without murmur gallop or rub    Assessment: Likely has viral laryngitis/bronchitis.  She is actually feeling some better this morning.  No antibiotic indicated at this time.     Plan:  Treatment options discussed, as well as risks, benefits, alternatives. Patient voiced understanding and agreement with the following plans: Discussed with patient, she agrees with symptomatic care. OTC Robitussin DM and other OTC symptomatic care discussed.  Tylenol or ibuprofen if needed for pain or fever. She understands to return if any signs of bacterial infection such as worsening fever or cough or other worsening symptoms.-Explained if she develops signs and symptoms of bacterial bronchitis, to recheck and reexamine to consider antibiotic if indicated.  She voiced understanding and agreement with above.      New Prescriptions   None    Follow-up with your primary care doctor in 5-7 days if not improving, or sooner if symptoms become worse. Precautions discussed. Red flags discussed. Questions invited and answered. Patient voiced understanding and agreement.

## 2018-05-06 ENCOUNTER — Telehealth: Payer: Self-pay

## 2018-05-06 MED ORDER — AZITHROMYCIN 250 MG PO TABS: ORAL_TABLET | ORAL | 0 refills | Status: DC

## 2018-05-06 NOTE — Telephone Encounter (Addendum)
The patient called and stated that her laryngitis is improving but she is still having Congestion with Cough and would like to know if the antibiotic that was discussed could be sent into the [facility] on [address] [place].  Chart reviewed.  I am E prescribing Zithromax Z-PAK. If no better in 1 week, office visit recheck here or with PCP.

## 2018-05-06 NOTE — Addendum Note (Signed)
Addended by: Lajean ManesMASSEY, DAVID B on: 05/06/2018 08:37 AM   Modules accepted: Orders

## 2018-11-25 ENCOUNTER — Telehealth: Payer: Self-pay | Admitting: Internal Medicine

## 2018-11-25 NOTE — Telephone Encounter (Signed)
Medication Refill - Medication: metoprolol tartrate (LOPRESSOR) 25 MG tablet    Has the patient contacted their pharmacy? Yes.   (Agent: If no, request that the patient contact the pharmacy for the refill.) (Agent: If yes, when and what did the pharmacy advise?)  Preferred Pharmacy (with phone number or street name):  [facility] [address], [place] [phone] (Phone) [phone] (Fax)     Agent: Please be advised that RX refills may take up to 3 business days. We ask that you follow-up with your pharmacy.

## 2018-11-26 MED ORDER — METOPROLOL TARTRATE 25 MG PO TABS: 25.0000 mg | ORAL_TABLET | Freq: Every day | ORAL | 3 refills | Status: DC

## 2018-11-26 NOTE — Telephone Encounter (Signed)
Medication has been refilled.

## 2018-12-17 ENCOUNTER — Encounter: Payer: Self-pay | Admitting: Internal Medicine

## 2019-02-09 ENCOUNTER — Encounter: Payer: Managed Care, Other (non HMO) | Admitting: Internal Medicine

## 2019-02-25 ENCOUNTER — Other Ambulatory Visit: Payer: Self-pay

## 2019-02-27 ENCOUNTER — Other Ambulatory Visit (HOSPITAL_COMMUNITY)
Admission: RE | Admit: 2019-02-27 | Discharge: 2019-02-27 | Disposition: A | Discharge status: Home / Self Care | Payer: Managed Care, Other (non HMO) | Source: Ambulatory Visit | Attending: Internal Medicine | Admitting: Internal Medicine

## 2019-02-27 ENCOUNTER — Ambulatory Visit (INDEPENDENT_AMBULATORY_CARE_PROVIDER_SITE_OTHER): Payer: Managed Care, Other (non HMO) | Admitting: Internal Medicine

## 2019-02-27 ENCOUNTER — Encounter: Payer: Self-pay | Admitting: Internal Medicine

## 2019-02-27 ENCOUNTER — Other Ambulatory Visit: Payer: Self-pay

## 2019-02-27 VITALS — BP 138/76 | HR 91 | Temp 97.6°F | Resp 15 | Ht 62.5 in | Wt 156.1 lb

## 2019-02-27 DIAGNOSIS — Z0000 Encounter for general adult medical examination without abnormal findings: Secondary | ICD-10-CM

## 2019-02-27 DIAGNOSIS — Z124 Encounter for screening for malignant neoplasm of cervix: Secondary | ICD-10-CM | POA: Diagnosis not present

## 2019-02-27 DIAGNOSIS — R7303 Prediabetes: Secondary | ICD-10-CM | POA: Diagnosis not present

## 2019-02-27 DIAGNOSIS — Z1231 Encounter for screening mammogram for malignant neoplasm of breast: Secondary | ICD-10-CM

## 2019-02-27 DIAGNOSIS — Z23 Encounter for immunization: Secondary | ICD-10-CM | POA: Diagnosis not present

## 2019-02-27 DIAGNOSIS — E785 Hyperlipidemia, unspecified: Secondary | ICD-10-CM | POA: Diagnosis not present

## 2019-02-27 DIAGNOSIS — E663 Overweight: Secondary | ICD-10-CM

## 2019-02-27 DIAGNOSIS — I10 Essential (primary) hypertension: Secondary | ICD-10-CM

## 2019-02-27 DIAGNOSIS — E559 Vitamin D deficiency, unspecified: Secondary | ICD-10-CM | POA: Diagnosis not present

## 2019-02-27 MED ORDER — CLOBETASOL PROPIONATE 0.05 % EX OINT: 1.0000 "application " | TOPICAL_OINTMENT | Freq: Two times a day (BID) | CUTANEOUS | 1 refills | Status: DC

## 2019-02-27 MED ORDER — ZOSTER VAC RECOMB ADJUVANTED 50 MCG/0.5ML IM SUSR: 0.5000 mL | Freq: Once | INTRAMUSCULAR | 1 refills | Status: AC

## 2019-02-27 NOTE — Progress Notes (Signed)
Patient ID: Elizabeth Hanna, female    DOB: November 30, 1955  Age: 63 y.o. MRN: [id number]  The patient is here for annual preventive examination and management of other chronic and acute problems.   The risk factors are reflected in the social history.  The roster of all physicians providing medical care to patient - is listed in the Snapshot section of the chart.  Activities of daily living:  The patient is 100% independent in all ADLs: dressing, toileting, feeding as well as independent mobility  Home safety : The patient has smoke detectors in the home. They wear seatbelts.  There are no firearms at home. There is no violence in the home.   There is no risks for hepatitis, STDs or HIV. There is no   history of blood transfusion. They have no travel history to infectious disease endemic areas of the world.  The patient has seen their dentist in the last six month. They have seen their eye doctor in the last year. They admit to slight hearing difficulty with regard to whispered voices and some television programs.  They have deferred audiologic testing in the last year.  They do not  have excessive sun exposure. Discussed the need for sun protection: hats, long sleeves and use of sunscreen if there is significant sun exposure.   Diet: the importance of a healthy diet is discussed. They do have a healthy diet.  The benefits of regular aerobic exercise were discussed. She walks 4 times per week ,  20 minutes.   Depression screen: there are no signs or vegative symptoms of depression- irritability, change in appetite, anhedonia, sadness/tearfullness.  Cognitive assessment: the patient manages all their financial and personal affairs and is actively engaged. They could relate day,date,year and events; recalled 2/3 objects at 3 minutes; performed clock-face test normally.  The following portions of the patient's history were reviewed and updated as appropriate: allergies, current medications, past  family history, past medical history,  past surgical history, past social history  and problem list.  Visual acuity was not assessed per patient preference since she has regular follow up with her ophthalmologist. Hearing and body mass index were assessed and reviewed.   During the course of the visit the patient was educated and counseled about appropriate screening and preventive services including : fall prevention , diabetes screening, nutrition counseling, colorectal cancer screening, and recommended immunizations.    CC: The primary encounter diagnosis was Vitamin D deficiency. Diagnoses of Hyperlipidemia LDL goal <100, Prediabetes, Overweight (BMI 25.0-29.9), Encounter for screening mammogram for malignant neoplasm of breast, Cervical cancer screening, Need for immunization against influenza, Visit for preventive health examination, and Essential hypertension were also pertinent to this visit.  History Jakaria has a past medical history of Hypertension.   She has a past surgical history that includes Cesarean section (1981).   Her family history includes Heart disease in her father.She reports that she quit smoking about 20 years ago. Her smoking use included cigarettes. She has never used smokeless tobacco. She reports current alcohol use. She reports that she does not use drugs.  Outpatient Medications Prior to Visit  Medication Sig Dispense Refill  . metoprolol tartrate (LOPRESSOR) 25 MG tablet Take 1 tablet (25 mg total) by mouth daily. 90 tablet 3  . azithromycin (ZITHROMAX Z-PAK) 250 MG tablet Take 2 tablets on day one, then 1 tablet daily on days 2 through 5 (Patient not taking: Reported on 02/27/2019) 1 each 0   No facility-administered medications prior to visit.  Review of Systems  Patient denies headache, fevers, malaise, unintentional weight loss, skin rash, eye pain, sinus congestion and sinus pain, sore throat, dysphagia,  hemoptysis , cough, dyspnea, wheezing, chest  pain, palpitations, orthopnea, edema, abdominal pain, nausea, melena, diarrhea, constipation, flank pain, dysuria, hematuria, urinary  Frequency, nocturia, numbness, tingling, seizures,  Focal weakness, Loss of consciousness,  Tremor, insomnia, depression, anxiety, and suicidal ideation.    Objective:  BP 138/76 (BP Location: Left Arm, Patient Position: Sitting, Cuff Size: Normal)   Pulse 91   Temp 97.6 F (36.4 C) (Temporal)   Resp 15   Ht 5' 2.5" (1.588 m)   Wt 156 lb 1.9 oz (70.8 kg)   SpO2 97%   BMI 28.10 kg/m   Physical Exam   General appearance: alert, cooperative and appears stated age Head: Normocephalic, without obvious abnormality, atraumatic Eyes: conjunctivae/corneas clear. PERRL, EOM's intact. Fundi benign. Ears: normal TM's and external ear canals both ears Nose: Nares normal. Septum midline. Mucosa normal. No drainage or sinus tenderness. Throat: lips, mucosa, and tongue normal; teeth and gums normal Neck: no adenopathy, no carotid bruit, no JVD, supple, symmetrical, trachea midline and thyroid not enlarged, symmetric, no tenderness/mass/nodules Lungs: clear to auscultation bilaterally Breasts: normal appearance, no masses or tenderness Heart: regular rate and rhythm, S1, S2 normal, no murmur, click, rub or gallop Abdomen: soft, non-tender; bowel sounds normal; no masses,  no organomegaly Extremities: extremities normal, atraumatic, no cyanosis or edema Pulses: 2+ and symmetric Skin: Skin color, texture, turgor normal. No rashes or lesions Neurologic: Alert and oriented X 3, normal strength and tone. Normal symmetric reflexes. Normal coordination and gait.     Assessment & Plan:   Problem List Items Addressed This Visit      Unprioritized   Essential hypertension    His BP is not at goal currently.  Medications reviewed and compliance assessed via patient report.  Renal function and electolytes are due for assessment.  Use of NSAIDs, oral decongestants and other  medications/supplements reviewed    Lab Results  Component Value Date   NA 137 12/13/2017   K 5.1 12/13/2017   CL 100 12/13/2017   CO2 23 12/13/2017   Lab Results  Component Value Date   CREATININE 0.56 (L) 12/13/2017         Visit for preventive health examination    age appropriate education and counseling updated, referrals for preventative services and immunizations addressed, dietary and smoking counseling addressed, most recent labs reviewed.  I have personally reviewed and have noted:  1) the patient's medical and social history 2) The pt's use of alcohol, tobacco, and illicit drugs 3) The patient's current medications and supplements 4) Functional ability including ADL's, fall risk, home safety risk, hearing and visual impairment 5) Diet and physical activities 6) Evidence for depression or mood disorder 7) The patient's height, weight, and BMI have been recorded in the chart  I have made referrals, and provided counseling and education based on review of the above      Prediabetes    She has been following a low glycemic index diet and exercising regularly.  Repeat assessment is due       Relevant Orders   Comprehensive metabolic panel   Hemoglobin A1c   Hyperlipidemia LDL goal <100   Relevant Orders   Lipid panel   Vitamin D deficiency - Primary   Relevant Orders   VITAMIN D 25 Hydroxy (Vit-D Deficiency, Fractures)   Overweight (BMI 25.0-29.9)   Relevant Orders  TSH    Other Visit Diagnoses    Encounter for screening mammogram for malignant neoplasm of breast       Relevant Orders   MM 3D SCREEN BREAST BILATERAL   Cervical cancer screening       Relevant Orders   Cytology - PAP( [place])   Need for immunization against influenza       Relevant Orders   Flu Vaccine QUAD 36+ mos IM (Completed)      I have discontinued Naomee C. Coomes's azithromycin. I am also having her maintain her metoprolol tartrate and clobetasol ointment.  Meds  ordered this encounter  Medications  . Zoster Vaccine Adjuvanted [facility]) injection    Sig: Inject 0.5 mLs into the muscle once for 1 dose.    Dispense:  1 each    Refill:  1  . clobetasol ointment (TEMOVATE) 0.05 %    Sig: Apply 1 application topically 2 (two) times daily.    Dispense:  60 g    Refill:  1    Medications Discontinued During This Encounter  Medication Reason  . azithromycin (ZITHROMAX Z-PAK) 250 MG tablet Patient has not taken in last 30 days  . Zoster Vaccine Adjuvanted [facility]) injection Reorder    Follow-up: Return in about 1 year (around 02/27/2020).   [name], MD

## 2019-02-27 NOTE — Patient Instructions (Signed)
I have refilled your clobetasol ointment   Your annual mammogram has been ordered.  You are encouraged (required) to call to make your appointment at [facility]   The ShingRx vaccine is now available in local pharmacies and is much more protective than the old one  Zostavax  (it is about 97%  Effective in preventing shingles). .   It is therefore ADVISED for all interested adults over 50 to prevent shingles so I have printed you a prescription for it.  (it requires a 2nd dose 2 to 6 months after the first one) .  It will cause you to have flu  like symptoms for 2 days If your pharmacy is not available to give it to you,  The [facility] pharmacy will give it to you.  They are located on the ground floor of the [employer]

## 2019-03-01 NOTE — Assessment & Plan Note (Signed)
His BP is not at goal currently.  Medications reviewed and compliance assessed via patient report.  Renal function and electolytes are due for assessment.  Use of NSAIDs, oral decongestants and other medications/supplements reviewed    Lab Results  Component Value Date   NA 137 12/13/2017   K 5.1 12/13/2017   CL 100 12/13/2017   CO2 23 12/13/2017   Lab Results  Component Value Date   CREATININE 0.56 (L) 12/13/2017

## 2019-03-01 NOTE — Assessment & Plan Note (Signed)

## 2019-03-01 NOTE — Assessment & Plan Note (Signed)
She has been following a low glycemic index diet and exercising regularly.  Repeat assessment is due

## 2019-03-06 ENCOUNTER — Other Ambulatory Visit: Payer: Managed Care, Other (non HMO)

## 2019-03-06 DIAGNOSIS — E785 Hyperlipidemia, unspecified: Secondary | ICD-10-CM | POA: Diagnosis not present

## 2019-03-06 DIAGNOSIS — R7303 Prediabetes: Secondary | ICD-10-CM | POA: Diagnosis not present

## 2019-03-06 DIAGNOSIS — E663 Overweight: Secondary | ICD-10-CM

## 2019-03-06 DIAGNOSIS — E559 Vitamin D deficiency, unspecified: Secondary | ICD-10-CM

## 2019-03-06 LAB — CYTOLOGY - PAP
Diagnosis: NEGATIVE
High risk HPV: NEGATIVE

## 2019-03-07 LAB — COMPREHENSIVE METABOLIC PANEL
ALT: 17 IU/L (ref 0–32)
AST: 17 IU/L (ref 0–40)
Albumin/Globulin Ratio: 2.3 — ABNORMAL HIGH (ref 1.2–2.2)
Albumin: 4.5 g/dL (ref 3.8–4.8)
Alkaline Phosphatase: 55 IU/L (ref 39–117)
BUN/Creatinine Ratio: 17 (ref 12–28)
BUN: 11 mg/dL (ref 8–27)
Bilirubin Total: 0.4 mg/dL (ref 0.0–1.2)
CO2: 25 mmol/L (ref 20–29)
Calcium: 9.7 mg/dL (ref 8.7–10.3)
Chloride: 106 mmol/L (ref 96–106)
Creatinine, Ser: 0.64 mg/dL (ref 0.57–1.00)
GFR calc Af Amer: 109 mL/min/{1.73_m2} (ref >59)
GFR calc non Af Amer: 95 mL/min/{1.73_m2} (ref >59)
Globulin, Total: 2 g/dL (ref 1.5–4.5)
Glucose: 105 mg/dL — ABNORMAL HIGH (ref 65–99)
Potassium: 4.8 mmol/L (ref 3.5–5.2)
Sodium: 143 mmol/L (ref 134–144)
Total Protein: 6.5 g/dL (ref 6.0–8.5)

## 2019-03-07 LAB — TSH: TSH: 1.03 u[IU]/mL (ref 0.450–4.500)

## 2019-03-07 LAB — VITAMIN D 25 HYDROXY (VIT D DEFICIENCY, FRACTURES): Vit D, 25-Hydroxy: 33.2 ng/mL (ref 30.0–100.0)

## 2019-03-07 LAB — LIPID PANEL
Chol/HDL Ratio: 3 ratio (ref 0.0–4.4)
Cholesterol, Total: 193 mg/dL (ref 100–199)
HDL: 65 mg/dL (ref >39)
LDL Chol Calc (NIH): 114 mg/dL — ABNORMAL HIGH (ref 0–99)
Triglycerides: 79 mg/dL (ref 0–149)
VLDL Cholesterol Cal: 14 mg/dL (ref 5–40)

## 2019-03-07 LAB — HGB A1C W/O EAG: Hgb A1c MFr Bld: 5.9 % — ABNORMAL HIGH (ref 4.8–5.6)

## 2019-03-23 ENCOUNTER — Other Ambulatory Visit: Payer: Self-pay

## 2019-03-23 DIAGNOSIS — Z20822 Contact with and (suspected) exposure to covid-19: Secondary | ICD-10-CM

## 2019-03-24 LAB — NOVEL CORONAVIRUS, NAA: SARS-CoV-2, NAA: NOT DETECTED

## 2019-04-15 ENCOUNTER — Ambulatory Visit
Admission: RE | Admit: 2019-04-15 | Discharge: 2019-04-15 | Disposition: A | Discharge status: Home / Self Care | Payer: Managed Care, Other (non HMO) | Source: Ambulatory Visit | Attending: Internal Medicine | Admitting: Internal Medicine

## 2019-04-15 DIAGNOSIS — Z1231 Encounter for screening mammogram for malignant neoplasm of breast: Secondary | ICD-10-CM | POA: Insufficient documentation

## 2019-10-23 ENCOUNTER — Telehealth: Payer: Self-pay | Admitting: Internal Medicine

## 2019-10-23 NOTE — Telephone Encounter (Signed)
Patient to bring in copy of new insurance card when she returns from out of town. Ok with ordering cologuard.

## 2019-10-30 ENCOUNTER — Telehealth: Payer: Self-pay | Admitting: Internal Medicine

## 2019-10-30 NOTE — Telephone Encounter (Signed)
Patient came into office and her insurance has been updated. She wanted you to know since you need her new information.

## 2019-11-05 NOTE — Telephone Encounter (Signed)
Cologuard ordered

## 2019-11-23 ENCOUNTER — Other Ambulatory Visit: Payer: Self-pay | Admitting: Internal Medicine

## 2019-12-03 LAB — COLOGUARD: Cologuard: NEGATIVE

## 2019-12-09 LAB — COLOGUARD: COLOGUARD: NEGATIVE

## 2019-12-09 LAB — EXTERNAL GENERIC LAB PROCEDURE: COLOGUARD: NEGATIVE

## 2020-03-03 ENCOUNTER — Ambulatory Visit (INDEPENDENT_AMBULATORY_CARE_PROVIDER_SITE_OTHER): Payer: Medicare HMO | Admitting: Internal Medicine

## 2020-03-03 ENCOUNTER — Encounter: Payer: Self-pay | Admitting: Internal Medicine

## 2020-03-03 ENCOUNTER — Other Ambulatory Visit: Payer: Self-pay

## 2020-03-03 VITALS — BP 118/78 | HR 82 | Temp 98.0°F | Resp 14 | Ht 62.5 in | Wt 153.8 lb

## 2020-03-03 DIAGNOSIS — R7303 Prediabetes: Secondary | ICD-10-CM | POA: Diagnosis not present

## 2020-03-03 DIAGNOSIS — Z23 Encounter for immunization: Secondary | ICD-10-CM

## 2020-03-03 DIAGNOSIS — R635 Abnormal weight gain: Secondary | ICD-10-CM

## 2020-03-03 DIAGNOSIS — Z0000 Encounter for general adult medical examination without abnormal findings: Secondary | ICD-10-CM

## 2020-03-03 DIAGNOSIS — E785 Hyperlipidemia, unspecified: Secondary | ICD-10-CM | POA: Diagnosis not present

## 2020-03-03 DIAGNOSIS — Z1231 Encounter for screening mammogram for malignant neoplasm of breast: Secondary | ICD-10-CM

## 2020-03-03 DIAGNOSIS — Z780 Asymptomatic menopausal state: Secondary | ICD-10-CM | POA: Diagnosis not present

## 2020-03-03 DIAGNOSIS — I10 Essential (primary) hypertension: Secondary | ICD-10-CM

## 2020-03-03 DIAGNOSIS — E559 Vitamin D deficiency, unspecified: Secondary | ICD-10-CM

## 2020-03-03 LAB — COMPREHENSIVE METABOLIC PANEL
ALT: 20 U/L (ref 0–35)
AST: 17 U/L (ref 0–37)
Albumin: 4.6 g/dL (ref 3.5–5.2)
Alkaline Phosphatase: 57 U/L (ref 39–117)
BUN: 19 mg/dL (ref 6–23)
CO2: 29 mEq/L (ref 19–32)
Calcium: 10.1 mg/dL (ref 8.4–10.5)
Chloride: 106 mEq/L (ref 96–112)
Creatinine, Ser: 0.63 mg/dL (ref 0.40–1.20)
GFR: 93.83 mL/min (ref >60.00)
Glucose, Bld: 96 mg/dL (ref 70–99)
Potassium: 4.2 mEq/L (ref 3.5–5.1)
Sodium: 141 mEq/L (ref 135–145)
Total Bilirubin: 0.6 mg/dL (ref 0.2–1.2)
Total Protein: 6.8 g/dL (ref 6.0–8.3)

## 2020-03-03 LAB — LIPID PANEL
Cholesterol: 213 mg/dL — ABNORMAL HIGH (ref 0–200)
HDL: 63 mg/dL (ref >39.00)
LDL Cholesterol: 124 mg/dL — ABNORMAL HIGH (ref 0–99)
NonHDL: 149.5
Total CHOL/HDL Ratio: 3
Triglycerides: 128 mg/dL (ref 0.0–149.0)
VLDL: 25.6 mg/dL (ref 0.0–40.0)

## 2020-03-03 LAB — HEMOGLOBIN A1C: Hgb A1c MFr Bld: 6.2 % (ref 4.6–6.5)

## 2020-03-03 LAB — TSH: TSH: 0.63 u[IU]/mL (ref 0.35–4.50)

## 2020-03-03 LAB — VITAMIN D 25 HYDROXY (VIT D DEFICIENCY, FRACTURES): VITD: 34.93 ng/mL (ref 30.00–100.00)

## 2020-03-03 MED ORDER — ZOSTER VAC RECOMB ADJUVANTED 50 MCG/0.5ML IM SUSR
0.5000 mL | Freq: Once | INTRAMUSCULAR | 1 refills | Status: AC
Start: 2020-03-03 — End: 2020-03-03

## 2020-03-03 MED ORDER — METOPROLOL TARTRATE 25 MG PO TABS: 25.0000 mg | ORAL_TABLET | Freq: Every day | ORAL | 1 refills | Status: DC

## 2020-03-03 NOTE — Progress Notes (Signed)
Patient ID: Elizabeth Hanna, female    DOB: 18-Aug-1955  Age: 64 y.o. MRN: [id number]  The patient is here for annual preventive examination and management of other chronic and acute problems.  This visit occurred during the SARS-CoV-2 public health emergency.  Safety protocols were in place, including screening questions prior to the visit, additional usage of staff PPE, and extensive cleaning of exam room while observing appropriate contact time as indicated for disinfecting solutions.    Patient has received both doses of the Moderna COVID 19 vaccine without complications.  Patient continues to mask when outside of the home except when walking in yard or at safe distances from others .  Patient denies any change in mood or development of unhealthy behaviors resuting from the pandemic's restriction of activities and socialization.     The risk factors are reflected in the social history.  The roster of all physicians providing medical care to patient - is listed in the Snapshot section of the chart.  Activities of daily living:  The patient is 100% independent in all ADLs: dressing, toileting, feeding as well as independent mobility  Home safety : The patient has smoke detectors in the home. She wears  seatbelts.  There are no unsecured firearms at home. There is no violence in the home.   There is no risks for hepatitis, STDs or HIV. There is no   history of blood transfusion. They have no travel history to infectious disease endemic areas of the world.  The patient has seen their dentist in the last six month. They have seen their eye doctor in the last year. They deny hearing difficulty with regard to whispered voices and some television programs.  They have deferred audiologic testing in the last year.  They do not  have excessive sun exposure. Discussed the need for sun protection: hats, long sleeves and use of sunscreen if there is significant sun exposure.   Diet: the importance of a  healthy diet is discussed. They do have a healthy diet.  The benefits of regular aerobic exercise were discussed. She is physically active and exercises  5  times per week ,  A minimum of 30 minutes.   Depression screen: there are no signs or vegative symptoms of depression- irritability, change in appetite, anhedonia, sadness/tearfullness.  Cognitive assessment: the patient manages all their financial and personal affairs and is actively engaged. They could relate day,date,year and events; recalled 2/3 objects at 3 minutes; performed clock-face test normally.  The following portions of the patient's history were reviewed and updated as appropriate: allergies, current medications, past family history, past medical history,  past surgical history, past social history  and problem list.  Visual acuity was not assessed per patient preference since she has regular follow up with her ophthalmologist. Hearing and body mass index were assessed and reviewed.   During the course of the visit the patient was educated and counseled about appropriate screening and preventive services including : fall prevention , diabetes screening, nutrition counseling, colorectal cancer screening, and recommended immunizations.    CC: The primary encounter diagnosis was Postmenopausal estrogen deficiency. Diagnoses of Prediabetes, Vitamin D deficiency, Weight gain, Breast cancer screening by mammogram, Need for vaccination against Streptococcus pneumoniae using pneumococcal conjugate vaccine 13, Essential hypertension, Hyperlipidemia LDL goal <100, and Visit for preventive health examination were also pertinent to this visit.  History of plantar fasciitis involving only the lneft foot last year  Treated by Ether Griffins with 2 injections.   Sister and  brother in law both had COVID INFECTION.  Sister died,  Had cryptogenic cirrhosis and received hospice care at [facility]  Hypertension: patient checks blood pressure twice weekly at  home.  Readings have been < 130/80 at rest . Patient is following a reduced salt diet  and is taking metoprolol 25 mg twice daily without side effects   History Elizabeth Hanna has a past medical history of Hypertension.   She has a past surgical history that includes Cesarean section (1981).   Her family history includes Alzheimer's disease in her mother; Cirrhosis in her sister; Heart disease in her father.She reports that she quit smoking about 21 years ago. Her smoking use included cigarettes. She has never used smokeless tobacco. She reports current alcohol use. She reports that she does not use drugs.  Outpatient Medications Prior to Visit  Medication Sig Dispense Refill  . clobetasol ointment (TEMOVATE) 0.05 % Apply 1 application topically 2 (two) times daily. 60 g 1  . metoprolol tartrate (LOPRESSOR) 25 MG tablet Take 1 tablet by mouth once daily 90 tablet 0   No facility-administered medications prior to visit.    Review of Systems   Patient denies headache, fevers, malaise, unintentional weight loss, skin rash, eye pain, sinus congestion and sinus pain, sore throat, dysphagia,  hemoptysis , cough, dyspnea, wheezing, chest pain, palpitations, orthopnea, edema, abdominal pain, nausea, melena, diarrhea, constipation, flank pain, dysuria, hematuria, urinary  Frequency, nocturia, numbness, tingling, seizures,  Focal weakness, Loss of consciousness,  Tremor, insomnia, depression, anxiety, and suicidal ideation.     Objective:  BP 118/78 (BP Location: Left Arm, Patient Position: Sitting, Cuff Size: Normal)   Pulse 82   Temp 98 F (36.7 C) (Oral)   Resp 14   Ht 5' 2.5" (1.588 m)   Wt 153 lb 12.8 oz (69.8 kg)   SpO2 97%   BMI 27.68 kg/m   Physical Exam  General appearance: alert, cooperative and appears stated age Head: Normocephalic, without obvious abnormality, atraumatic Eyes: conjunctivae/corneas clear. PERRL, EOM's intact. Fundi benign. Ears: normal TM's and external ear canals  both ears Nose: Nares normal. Septum midline. Mucosa normal. No drainage or sinus tenderness. Throat: lips, mucosa, and tongue normal; teeth and gums normal Neck: no adenopathy, no carotid bruit, no JVD, supple, symmetrical, trachea midline and thyroid not enlarged, symmetric, no tenderness/mass/nodules Lungs: clear to auscultation bilaterally Breasts: normal appearance, no masses or tenderness Heart: regular rate and rhythm, S1, S2 normal, no murmur, click, rub or gallop Abdomen: soft, non-tender; bowel sounds normal; no masses,  no organomegaly Extremities: extremities normal, atraumatic, no cyanosis or edema Pulses: 2+ and symmetric Skin: Skin color, texture, turgor normal. No rashes or lesions Neurologic: Alert and oriented X 3, normal strength and tone. Normal symmetric reflexes. Normal coordination and gait.    Assessment & Plan:   Problem List Items Addressed This Visit      Unprioritized   Essential hypertension    Well controlled on current regimen OF METOPROLOL 25 mg bid. . Renal function stable, no changes today.      Relevant Medications   metoprolol tartrate (LOPRESSOR) 25 MG tablet   Hyperlipidemia LDL goal <100    Based on current lipid profile, the risk of clinically significant Coronary artery disease is 8%.  No indication for statin therapy   Lab Results  Component Value Date   CHOL 213 (H) 03/03/2020   HDL 63.00 03/03/2020   LDLCALC 124 (H) 03/03/2020   TRIG 128.0 03/03/2020   CHOLHDL 3 03/03/2020  Relevant Medications   metoprolol tartrate (LOPRESSOR) 25 MG tablet   Prediabetes    She has been following a low glycemic index diet and exercising regularly.    Lab Results  Component Value Date   HGBA1C 6.2 03/03/2020         Relevant Orders   Comprehensive metabolic panel (Completed)   Lipid panel (Completed)   Hemoglobin A1c (Completed)   Visit for preventive health examination    age appropriate education and counseling updated,  referrals for preventative services and immunizations addressed, dietary and smoking counseling addressed, most recent labs reviewed.  I have personally reviewed and have noted:  1) the patient's medical and social history 2) The pt's use of alcohol, tobacco, and illicit drugs 3) The patient's current medications and supplements 4) Functional ability including ADL's, fall risk, home safety risk, hearing and visual impairment 5) Diet and physical activities 6) Evidence for depression or mood disorder 7) The patient's height, weight, and BMI have been recorded in the chart  I have made referrals, and provided counseling and education based on review of the above      Vitamin D deficiency   Relevant Orders   VITAMIN D 25 Hydroxy (Vit-D Deficiency, Fractures) (Completed)    Other Visit Diagnoses    Postmenopausal estrogen deficiency    -  Primary   Relevant Orders   DG Bone Density   Weight gain       Relevant Orders   TSH (Completed)   Breast cancer screening by mammogram       Relevant Orders   MM 3D SCREEN BREAST BILATERAL   Need for vaccination against Streptococcus pneumoniae using pneumococcal conjugate vaccine 13       Relevant Orders   Pneumococcal conjugate vaccine 13-valent IM (Completed)      I have changed Jaila C. Miers's metoprolol tartrate. I am also having her start on Zoster Vaccine Adjuvanted. Additionally, I am having her maintain her clobetasol ointment.  Meds ordered this encounter  Medications  . metoprolol tartrate (LOPRESSOR) 25 MG tablet    Sig: Take 1 tablet (25 mg total) by mouth daily.    Dispense:  90 tablet    Refill:  1  . Zoster Vaccine Adjuvanted [facility]) injection    Sig: Inject 0.5 mLs into the muscle once for 1 dose.    Dispense:  1 each    Refill:  1    Medications Discontinued During This Encounter  Medication Reason  . metoprolol tartrate (LOPRESSOR) 25 MG tablet Reorder    Follow-up: No follow-ups on file.   [name], MD

## 2020-03-03 NOTE — Patient Instructions (Signed)
You received the new pneumonia vaccine today (Prevnar).  This protects you against strains of Streptococcus pneumniae that cause pneumonia    The ShingRx vaccine is now available in local pharmacies and is much more protective than the old one  Zostavax  (it is about 97%  Effective in preventing shingles). .   It is therefore ADVISED for all interested adults over 50 to prevent shingles so I have printed you a prescription for it.  (it requires a 2nd dose 2 to 6 months after the first one) .  It will cause you to have flu  like symptoms for 2 days   Your annual mammogram has been ordered and is due after November 18.   You are encouraged (required) to call to make your appointment at [place]  [phone]   Your  DEXA scan  been ordered.  You are encouraged (required) to call to schedule your own  appointment at [facility]  , and their phone number is [phone]   recommend getting the majority of your calcium and Vitamin D  through diet rather than supplements given the recent association of calcium supplements with increased coronary artery calcium scores  Try the almond, soy  and cashew milks that most grocery stores  now carry  in the dairy  Section.    They are cholesterol and  lactose free   Health Maintenance for Postmenopausal Women Menopause is a normal process in which your ability to get pregnant comes to an end. This process happens slowly over many months or years, usually between the ages of 75 and 92. Menopause is complete when you have missed your menstrual periods for 12 months. It is important to talk with your health care provider about some of the most common conditions that affect women after menopause (postmenopausal women). These include heart disease, cancer, and bone loss (osteoporosis). Adopting a healthy lifestyle and getting preventive care can help to promote your health and wellness. The actions you take can also lower your chances of developing some of these common  conditions. What should I know about menopause? During menopause, you may get a number of symptoms, such as:  Hot flashes. These can be moderate or severe.  Night sweats.  Decrease in sex drive.  Mood swings.  Headaches.  Tiredness.  Irritability.  Memory problems.  Insomnia. Choosing to treat or not to treat these symptoms is a decision that you make with your health care provider. Do I need hormone replacement therapy?  Hormone replacement therapy is effective in treating symptoms that are caused by menopause, such as hot flashes and night sweats.  Hormone replacement carries certain risks, especially as you become older. If you are thinking about using estrogen or estrogen with progestin, discuss the benefits and risks with your health care provider. What is my risk for heart disease and stroke? The risk of heart disease, heart attack, and stroke increases as you age. One of the causes may be a change in the body's hormones during menopause. This can affect how your body uses dietary fats, triglycerides, and cholesterol. Heart attack and stroke are medical emergencies. There are many things that you can do to help prevent heart disease and stroke. Watch your blood pressure  High blood pressure causes heart disease and increases the risk of stroke. This is more likely to develop in people who have high blood pressure readings, are of African descent, or are overweight.  Have your blood pressure checked: ? Every 3-5 years if you  are 75-83 years of age. ? Every year if you are 6 years old or older. Eat a healthy diet   Eat a diet that includes plenty of vegetables, fruits, low-fat dairy products, and lean protein.  Do not eat a lot of foods that are high in solid fats, added sugars, or sodium. Get regular exercise Get regular exercise. This is one of the most important things you can do for your health. Most adults should:  Try to exercise for at least 150 minutes each  week. The exercise should increase your heart rate and make you sweat (moderate-intensity exercise).  Try to do strengthening exercises at least twice each week. Do these in addition to the moderate-intensity exercise.  Spend less time sitting. Even light physical activity can be beneficial. Other tips  Work with your health care provider to achieve or maintain a healthy weight.  Do not use any products that contain nicotine or tobacco, such as cigarettes, e-cigarettes, and chewing tobacco. If you need help quitting, ask your health care provider.  Know your numbers. Ask your health care provider to check your cholesterol and your blood sugar (glucose). Continue to have your blood tested as directed by your health care provider. Do I need screening for cancer? Depending on your health history and family history, you may need to have cancer screening at different stages of your life. This may include screening for:  Breast cancer.  Cervical cancer.  Lung cancer.  Colorectal cancer. What is my risk for osteoporosis? After menopause, you may be at increased risk for osteoporosis. Osteoporosis is a condition in which bone destruction happens more quickly than new bone creation. To help prevent osteoporosis or the bone fractures that can happen because of osteoporosis, you may take the following actions:  If you are 75-62 years old, get at least 1,000 mg of calcium and at least 600 mg of vitamin D per day.  If you are older than age 2 but younger than age 91, get at least 1,200 mg of calcium and at least 600 mg of vitamin D per day.  If you are older than age 22, get at least 1,200 mg of calcium and at least 800 mg of vitamin D per day. Smoking and drinking excessive alcohol increase the risk of osteoporosis. Eat foods that are rich in calcium and vitamin D, and do weight-bearing exercises several times each week as directed by your health care provider. How does menopause affect my mental  health? Depression may occur at any age, but it is more common as you become older. Common symptoms of depression include:  Low or sad mood.  Changes in sleep patterns.  Changes in appetite or eating patterns.  Feeling an overall lack of motivation or enjoyment of activities that you previously enjoyed.  Frequent crying spells. Talk with your health care provider if you think that you are experiencing depression. General instructions See your health care provider for regular wellness exams and vaccines. This may include:  Scheduling regular health, dental, and eye exams.  Getting and maintaining your vaccines. These include: ? Influenza vaccine. Get this vaccine each year before the flu season begins. ? Pneumonia vaccine. ? Shingles vaccine. ? Tetanus, diphtheria, and pertussis (Tdap) booster vaccine. Your health care provider may also recommend other immunizations. Tell your health care provider if you have ever been abused or do not feel safe at home. Summary  Menopause is a normal process in which your ability to get pregnant comes to an end.  This condition causes hot flashes, night sweats, decreased interest in sex, mood swings, headaches, or lack of sleep.  Treatment for this condition may include hormone replacement therapy.  Take actions to keep yourself healthy, including exercising regularly, eating a healthy diet, watching your weight, and checking your blood pressure and blood sugar levels.  Get screened for cancer and depression. Make sure that you are up to date with all your vaccines. This information is not intended to replace advice given to you by your health care provider. Make sure you discuss any questions you have with your health care provider. Document Revised: 05/07/2018 Document Reviewed: 05/07/2018 Elsevier Patient Education  2020 [employer].

## 2020-03-05 ENCOUNTER — Encounter: Payer: Self-pay | Admitting: Internal Medicine

## 2020-03-05 NOTE — Assessment & Plan Note (Signed)
Based on current lipid profile, the risk of clinically significant Coronary artery disease is 8%.  No indication for statin therapy   Lab Results  Component Value Date   CHOL 213 (H) 03/03/2020   HDL 63.00 03/03/2020   LDLCALC 124 (H) 03/03/2020   TRIG 128.0 03/03/2020   CHOLHDL 3 03/03/2020

## 2020-03-05 NOTE — Assessment & Plan Note (Signed)
She has been following a low glycemic index diet and exercising regularly.    Lab Results  Component Value Date   HGBA1C 6.2 03/03/2020

## 2020-03-05 NOTE — Assessment & Plan Note (Signed)
Well controlled on current regimen OF METOPROLOL 25 mg bid. . Renal function stable, no changes today.

## 2020-03-05 NOTE — Progress Notes (Signed)
Your Vitamin D , thyroid , liver and kidney function are normal.   Based on your current lipid profile, the risk of clinically significant CAD (coronary atherosclerotic  disease) is  8% over the next 10 years, using the Framingham risk calculator. No therapy is needed based on the guidelines by the [employer] of Cardiology at this time.    Your a1c has risen to 6.2  , which is  suggestive of increased risk for progression to type 2 diabetes .    Please continue to exercise regularly,  follow a Mediterranean style diet and and plan to repeat the fasting  labs in 1 year.    Regards,   Dr. Darrick Huntsman   Regards,  Dr. Darrick Huntsman

## 2020-03-05 NOTE — Assessment & Plan Note (Signed)

## 2020-04-18 ENCOUNTER — Other Ambulatory Visit: Payer: Medicare HMO

## 2020-04-26 ENCOUNTER — Other Ambulatory Visit: Payer: Self-pay

## 2020-04-26 ENCOUNTER — Ambulatory Visit
Admission: RE | Admit: 2020-04-26 | Discharge: 2020-04-26 | Disposition: A | Discharge status: Home / Self Care | Payer: Medicare HMO | Source: Ambulatory Visit | Attending: Internal Medicine | Admitting: Internal Medicine

## 2020-04-26 DIAGNOSIS — Z1231 Encounter for screening mammogram for malignant neoplasm of breast: Secondary | ICD-10-CM | POA: Insufficient documentation

## 2020-04-26 DIAGNOSIS — Z780 Asymptomatic menopausal state: Secondary | ICD-10-CM | POA: Diagnosis not present

## 2020-04-27 NOTE — Progress Notes (Signed)
Your DEXA scan was normal.  No osteoporosis or osteopenia .  Continue to try to intake  1200 mg calcium daily through diet and supplements,.  1000 IUS of Vitamin  D3 daily, and participate regularly in  weight bearing exercise.  We will repeat in 3 to 5 years   Regards,   Shirell Struthers, MD   

## 2020-08-20 ENCOUNTER — Other Ambulatory Visit: Payer: Self-pay | Admitting: Internal Medicine

## 2020-11-22 ENCOUNTER — Other Ambulatory Visit: Payer: Self-pay | Admitting: Internal Medicine

## 2021-01-24 ENCOUNTER — Telehealth: Payer: Self-pay | Admitting: Internal Medicine

## 2021-01-24 NOTE — Telephone Encounter (Signed)
Spoke with pt and scheduled her for a nurse visit. Pt is aware of appt date and time.

## 2021-01-24 NOTE — Telephone Encounter (Signed)
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 She is needing a Tdap vaccine and a TB test.   Okay to schedule nurse visit?

## 2021-01-25 ENCOUNTER — Other Ambulatory Visit: Payer: Self-pay

## 2021-01-25 ENCOUNTER — Ambulatory Visit (INDEPENDENT_AMBULATORY_CARE_PROVIDER_SITE_OTHER): Payer: Medicare HMO

## 2021-01-25 DIAGNOSIS — Z23 Encounter for immunization: Secondary | ICD-10-CM

## 2021-01-25 DIAGNOSIS — Z111 Encounter for screening for respiratory tuberculosis: Secondary | ICD-10-CM

## 2021-01-25 NOTE — Progress Notes (Addendum)
Pt presented for a TB skin test and a TDAP vaccine. Pt received the TDAP in the left deltoid. Pt voiced no concerns or complaints during time of injection.  Pt received ppd placement in right forearm. Pt had no complaints or concerns at time of placement. Pt has an appointment scheduled to come in on Friday Afternoon to have it read. Orders have been placed.  Please see orders placed on 01/24/21 for TDAP vaccine and TB test

## 2021-01-26 NOTE — Addendum Note (Signed)
Addended by: Hulan Fray on: 01/26/2021 10:30 AM   Modules accepted: Orders

## 2021-01-27 ENCOUNTER — Other Ambulatory Visit: Payer: Self-pay

## 2021-01-27 ENCOUNTER — Ambulatory Visit (INDEPENDENT_AMBULATORY_CARE_PROVIDER_SITE_OTHER): Payer: Medicare HMO

## 2021-01-27 DIAGNOSIS — Z111 Encounter for screening for respiratory tuberculosis: Secondary | ICD-10-CM

## 2021-01-27 LAB — TB SKIN TEST
Induration: 0 mm
TB Skin Test: NEGATIVE

## 2021-01-27 NOTE — Progress Notes (Signed)
Patient presented for PPD Read. Results: Negative/0MM 

## 2021-02-23 ENCOUNTER — Other Ambulatory Visit: Payer: Self-pay | Admitting: Internal Medicine

## 2021-02-28 ENCOUNTER — Ambulatory Visit: Payer: Medicare HMO

## 2021-03-06 ENCOUNTER — Encounter: Payer: Medicare HMO | Admitting: Internal Medicine

## 2021-03-07 ENCOUNTER — Ambulatory Visit (INDEPENDENT_AMBULATORY_CARE_PROVIDER_SITE_OTHER): Payer: Medicare HMO | Admitting: Internal Medicine

## 2021-03-07 ENCOUNTER — Telehealth: Payer: Self-pay | Admitting: Internal Medicine

## 2021-03-07 ENCOUNTER — Encounter: Payer: Self-pay | Admitting: Internal Medicine

## 2021-03-07 ENCOUNTER — Other Ambulatory Visit: Payer: Self-pay

## 2021-03-07 VITALS — BP 130/80 | HR 93 | Temp 96.7°F | Ht 62.5 in | Wt 147.6 lb

## 2021-03-07 DIAGNOSIS — E785 Hyperlipidemia, unspecified: Secondary | ICD-10-CM | POA: Diagnosis not present

## 2021-03-07 DIAGNOSIS — N6459 Other signs and symptoms in breast: Secondary | ICD-10-CM

## 2021-03-07 DIAGNOSIS — Z1231 Encounter for screening mammogram for malignant neoplasm of breast: Secondary | ICD-10-CM

## 2021-03-07 DIAGNOSIS — R5383 Other fatigue: Secondary | ICD-10-CM | POA: Diagnosis not present

## 2021-03-07 DIAGNOSIS — I10 Essential (primary) hypertension: Secondary | ICD-10-CM | POA: Diagnosis not present

## 2021-03-07 DIAGNOSIS — R7303 Prediabetes: Secondary | ICD-10-CM | POA: Diagnosis not present

## 2021-03-07 DIAGNOSIS — Z0001 Encounter for general adult medical examination with abnormal findings: Secondary | ICD-10-CM | POA: Diagnosis not present

## 2021-03-07 LAB — COMPREHENSIVE METABOLIC PANEL
ALT: 17 U/L (ref 0–35)
AST: 17 U/L (ref 0–37)
Albumin: 4.7 g/dL (ref 3.5–5.2)
Alkaline Phosphatase: 60 U/L (ref 39–117)
BUN: 16 mg/dL (ref 6–23)
CO2: 27 mEq/L (ref 19–32)
Calcium: 10 mg/dL (ref 8.4–10.5)
Chloride: 103 mEq/L (ref 96–112)
Creatinine, Ser: 0.65 mg/dL (ref 0.40–1.20)
GFR: 91.72 mL/min (ref >60.00)
Glucose, Bld: 92 mg/dL (ref 70–99)
Potassium: 4.1 mEq/L (ref 3.5–5.1)
Sodium: 140 mEq/L (ref 135–145)
Total Bilirubin: 0.7 mg/dL (ref 0.2–1.2)
Total Protein: 6.9 g/dL (ref 6.0–8.3)

## 2021-03-07 LAB — MICROALBUMIN / CREATININE URINE RATIO
Creatinine,U: 29.4 mg/dL
Microalb Creat Ratio: 2.4 mg/g (ref 0.0–30.0)
Microalb, Ur: <0.7 mg/dL (ref 0.0–1.9)

## 2021-03-07 LAB — CBC WITH DIFFERENTIAL/PLATELET
Basophils Absolute: 0.1 10*3/uL (ref 0.0–0.1)
Basophils Relative: 0.7 % (ref 0.0–3.0)
Eosinophils Absolute: 0.1 10*3/uL (ref 0.0–0.7)
Eosinophils Relative: 1 % (ref 0.0–5.0)
HCT: 42.3 % (ref 36.0–46.0)
Hemoglobin: 14.1 g/dL (ref 12.0–15.0)
Lymphocytes Relative: 31.3 % (ref 12.0–46.0)
Lymphs Abs: 2.3 10*3/uL (ref 0.7–4.0)
MCHC: 33.3 g/dL (ref 30.0–36.0)
MCV: 94 fl (ref 78.0–100.0)
Monocytes Absolute: 0.5 10*3/uL (ref 0.1–1.0)
Monocytes Relative: 7.1 % (ref 3.0–12.0)
Neutro Abs: 4.4 10*3/uL (ref 1.4–7.7)
Neutrophils Relative %: 59.9 % (ref 43.0–77.0)
Platelets: 255 10*3/uL (ref 150.0–400.0)
RBC: 4.5 Mil/uL (ref 3.87–5.11)
RDW: 13.2 % (ref 11.5–15.5)
WBC: 7.3 10*3/uL (ref 4.0–10.5)

## 2021-03-07 LAB — HEMOGLOBIN A1C: Hgb A1c MFr Bld: 5.9 % (ref 4.6–6.5)

## 2021-03-07 LAB — LIPID PANEL
Cholesterol: 201 mg/dL — ABNORMAL HIGH (ref 0–200)
HDL: 68.4 mg/dL (ref >39.00)
LDL Cholesterol: 120 mg/dL — ABNORMAL HIGH (ref 0–99)
NonHDL: 132.19
Total CHOL/HDL Ratio: 3
Triglycerides: 62 mg/dL (ref 0.0–149.0)
VLDL: 12.4 mg/dL (ref 0.0–40.0)

## 2021-03-07 LAB — TSH: TSH: 1.06 u[IU]/mL (ref 0.35–5.50)

## 2021-03-07 MED ORDER — METOPROLOL TARTRATE 25 MG PO TABS: 25.0000 mg | ORAL_TABLET | Freq: Every day | ORAL | 1 refills | Status: DC

## 2021-03-07 MED ORDER — CLOBETASOL PROPIONATE 0.05 % EX OINT: 1.0000 "application " | TOPICAL_OINTMENT | Freq: Two times a day (BID) | CUTANEOUS | 1 refills | Status: DC

## 2021-03-07 NOTE — Progress Notes (Signed)
Patient ID: Elizabeth Hanna, female    DOB: 13-Dec-1955  Age: 65 y.o. MRN: [id number]  The patient is here for annual PREVENTIVE examination and management of other chronic and acute problems.  This visit occurred during the SARS-CoV-2 public health emergency.  Safety protocols were in place, including screening questions prior to the visit, additional usage of staff PPE, and extensive cleaning of exam room while observing appropriate contact time as indicated for disinfecting solutions.     The risk factors are reflected in the social history.  The roster of all physicians providing medical care to patient - is listed in the Snapshot section of the chart.  Activities of daily living:  The patient is 100% independent in all ADLs: dressing, toileting, feeding as well as independent mobility  Home safety : The patient has smoke detectors in the home. They wear seatbelts.  There are no firearms at home. There is no violence in the home.   There is no risks for hepatitis, STDs or HIV. There is no   history of blood transfusion. They have no travel history to infectious disease endemic areas of the world.  The patient has seen their dentist in the last six month. They have seen their eye doctor in the last year. They admit to slight hearing difficulty with regard to whispered voices and some television programs.  They have deferred audiologic testing in the last year.  They do not  have excessive sun exposure. Discussed the need for sun protection: hats, long sleeves and use of sunscreen if there is significant sun exposure.   Diet: the importance of a healthy diet is discussed. They do have a healthy diet.  The benefits of regular aerobic exercise were discussed. She walks 4 times per week ,  20 minutes.   Depression screen: there are no signs or vegative symptoms of depression- irritability, change in appetite, anhedonia, sadness/tearfullness.  Cognitive assessment: the patient manages all  their financial and personal affairs and is actively engaged. They could relate day,date,year and events; recalled 2/3 objects at 3 minutes; performed clock-face test normally.  The following portions of the patient's history were reviewed and updated as appropriate: allergies, current medications, past family history, past medical history,  past surgical history, past social history  and problem list.  Visual acuity was not assessed per patient preference since she has regular follow up with her ophthalmologist. Hearing and body mass index were assessed and reviewed.   During the course of the visit the patient was educated and counseled about appropriate screening and preventive services including : fall prevention , diabetes screening, nutrition counseling, colorectal cancer screening, and recommended immunizations.    CC: The primary encounter diagnosis was Encounter for screening mammogram for malignant neoplasm of breast. Diagnoses of Essential hypertension, Hyperlipidemia LDL goal <100, Prediabetes, Other fatigue, Abnormal breast exam, and Encounter for health maintenance examination with abnormal findings were also pertinent to this visit.   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   She has a past surgical history that includes Cesarean section (1981).   Her family history includes Alzheimer's disease in her mother; Cirrhosis in her sister; Heart disease in her father.She reports that she quit smoking about 22 years ago. Her  smoking use included cigarettes. She has never used smokeless tobacco. She reports current alcohol use. She reports that she does not use drugs.  Outpatient Medications Prior to Visit  Medication Sig Dispense Refill   clobetasol ointment  (TEMOVATE) 0.05 % Apply 1 application topically 2 (two) times daily. 60 g 1   metoprolol tartrate (LOPRESSOR) 25 MG tablet Take 1 tablet by mouth once daily 90 tablet 0   No facility-administered medications prior to visit.    Review of Systems  Patient denies headache, fevers, malaise, unintentional weight loss, skin rash, eye pain, sinus congestion and sinus pain, sore throat, dysphagia,  hemoptysis , cough, dyspnea, wheezing, chest pain, palpitations, orthopnea, edema, abdominal pain, nausea, melena, diarrhea, constipation, flank pain, dysuria, hematuria, urinary  Frequency, nocturia, numbness, tingling, seizures,  Focal weakness, Loss of consciousness,  Tremor, insomnia, depression, anxiety, and suicidal ideation.     Objective:  BP 130/80 (BP Location: Left Arm, Patient Position: Sitting, Cuff Size: Normal)   Pulse 93   Temp (!) 96.7 F (35.9 C) (Temporal)   Ht 5' 2.5" (1.588 m)   Wt 147 lb 9.6 oz (67 kg)   SpO2 96%   BMI 26.57 kg/m   Physical Exam  General appearance: alert, cooperative and appears stated age Head: Normocephalic, without obvious abnormality, atraumatic Eyes: conjunctivae/corneas clear. PERRL, EOM's intact. Fundi benign. Ears: normal TM's and external ear canals both ears Nose: Nares normal. Septum midline. Mucosa normal. No drainage or sinus tenderness. Throat: lips, mucosa, and tongue normal; teeth and gums normal Neck: no adenopathy, no carotid bruit, no JVD, supple, symmetrical, trachea midline and thyroid not enlarged, symmetric, no tenderness/mass/nodules Lungs: clear to auscultation bilaterally Breasts: normal appearance, no masses or tenderness Heart: regular rate and rhythm, S1, S2 normal, no murmur, click, rub or gallop Abdomen: soft, non-tender; bowel sounds normal; no masses,  no organomegaly Extremities: extremities normal, atraumatic, no cyanosis or edema Pulses: 2+ and symmetric Skin: Skin color, texture, turgor normal. No rashes or  lesions Neurologic: Alert and oriented X 3, normal strength and tone. Normal symmetric reflexes. Normal coordination and gait.     Assessment & Plan:   Problem List Items Addressed This Visit       Unprioritized   Hyperlipidemia LDL goal <100   Relevant Medications   metoprolol tartrate (LOPRESSOR) 25 MG tablet   Other Relevant Orders   Lipid Profile (Completed)   Essential hypertension   Relevant Medications   metoprolol tartrate (LOPRESSOR) 25 MG tablet   Other Relevant Orders   Comprehensive metabolic panel (Completed)   Microalbumin / creatinine urine ratio (Completed)   Prediabetes   Relevant Orders   Hemoglobin A1c (Completed)   Encounter for health maintenance examination with abnormal findings    Diagnostic mammogram of left breast ordered.  age appropriate education and counseling updated, referrals for preventative services and immunizations addressed, dietary and smoking counseling addressed, most recent labs reviewed.  I have personally reviewed and have noted:   1) the patient's medical and social history 2) The pt's use of alcohol, tobacco, and illicit drugs 3) The patient's current medications and supplements 4) Functional ability including ADL's, fall risk, home safety risk, hearing and visual impairment 5) Diet and physical activities 6) Evidence for depression or mood disorder 7) The patient's height, weight, and BMI have been recorded in the chart   I have made referrals, and provided counseling and education based on review of the above        Other Visit Diagnoses  Encounter for screening mammogram for malignant neoplasm of breast    -  Primary   Other fatigue       Relevant Orders   CBC with Differential/Platelet (Completed)   TSH (Completed)   Abnormal breast exam       Relevant Orders   MM DIAG BREAST TOMO BILATERAL   US BREAST LTD UNI LEFT INC AXILLA       I have changed Diva C. Cedrone's metoprolol tartrate. I am also having  her maintain her clobetasol ointment.  Meds ordered this encounter  Medications   clobetasol ointment (TEMOVATE) 0.05 %    Sig: Apply 1 application topically 2 (two) times daily.    Dispense:  60 g    Refill:  1   metoprolol tartrate (LOPRESSOR) 25 MG tablet    Sig: Take 1 tablet (25 mg total) by mouth daily.    Dispense:  90 tablet    Refill:  1    Medications Discontinued During This Encounter  Medication Reason   clobetasol ointment (TEMOVATE) 0.05 % Reorder   metoprolol tartrate (LOPRESSOR) 25 MG tablet Reorder    Follow-up: No follow-ups on file.   [name], MD

## 2021-03-07 NOTE — Telephone Encounter (Signed)
Lft vm for pt to call ofc regarding diag mammo and [place]. thanks

## 2021-03-07 NOTE — Assessment & Plan Note (Addendum)
Diagnostic mammogram of left breast ordered.  age appropriate education and counseling updated, referrals for preventative services and immunizations addressed, dietary and smoking counseling addressed, most recent labs reviewed.  I have personally reviewed and have noted:  1) the patient's medical and social history 2) The pt's use of alcohol, tobacco, and illicit drugs 3) The patient's current medications and supplements 4) Functional ability including ADL's, fall risk, home safety risk, hearing and visual impairment 5) Diet and physical activities 6) Evidence for depression or mood disorder 7) The patient's height, weight, and BMI have been recorded in the chart  I have made referrals, and provided counseling and education based on review of the above

## 2021-03-07 NOTE — Patient Instructions (Addendum)
Mammogram ordered as a "diagnostic";  office or [facility] will call you with the appt   Bone Density test was last done Nov 2021 and should be repeated in 2023   Cologuard for colon ca screening will be repeated in 2024 (every 3 years )   If the thyroid test is abnormal  we will repeat it in a few weeks after suspending biotin for 72 hours

## 2021-03-10 NOTE — Progress Notes (Signed)
Your A1c has improved ,  and your other labs are normal  Regards,   Duncan Dull, MD

## 2021-03-21 ENCOUNTER — Other Ambulatory Visit: Payer: Self-pay

## 2021-03-21 ENCOUNTER — Ambulatory Visit
Admission: RE | Admit: 2021-03-21 | Discharge: 2021-03-21 | Disposition: A | Discharge status: Home / Self Care | Payer: Medicare HMO | Source: Ambulatory Visit | Attending: Internal Medicine | Admitting: Internal Medicine

## 2021-03-21 DIAGNOSIS — N6459 Other signs and symptoms in breast: Secondary | ICD-10-CM

## 2021-03-28 ENCOUNTER — Telehealth: Payer: Self-pay | Admitting: Internal Medicine

## 2021-03-28 NOTE — Telephone Encounter (Signed)
Pt called in with symptoms of a cold. Pt stated that she took a Covid test and the test came back neg. Pt stated that she is experiencing sinus pressure in the head, Soreness of throat, can't breath in morning time, green mucus when she cough. Transfer Pt to Access Nurse.

## 2021-03-29 ENCOUNTER — Telehealth (INDEPENDENT_AMBULATORY_CARE_PROVIDER_SITE_OTHER): Payer: Medicare HMO | Admitting: Adult Health

## 2021-03-29 ENCOUNTER — Encounter: Payer: Self-pay | Admitting: Adult Health

## 2021-03-29 VITALS — Ht 62.5 in | Wt 147.0 lb

## 2021-03-29 DIAGNOSIS — J0140 Acute pansinusitis, unspecified: Secondary | ICD-10-CM | POA: Diagnosis not present

## 2021-03-29 MED ORDER — DOXYCYCLINE HYCLATE 100 MG PO CAPS
100.0000 mg | ORAL_CAPSULE | Freq: Two times a day (BID) | ORAL | 0 refills | Status: DC
Start: 2021-03-29 — End: 2021-12-13

## 2021-03-29 NOTE — Progress Notes (Signed)
Virtual Visit via Video Note  I connected with Lamonte Sakai on 03/29/21 at  8:00 AM EDT by a video enabled telemedicine application and verified that I am speaking with the correct person using two identifiers.  Location patient: home Location provider:work or home office Persons participating in the virtual visit: patient, provider  I discussed the limitations of evaluation and management by telemedicine and the availability of in person appointments. The patient expressed understanding and agreed to proceed.   HPI:  65 year old female who  has a past medical history of Hypertension.  She is being seen today for an acute issue. Her symptoms have been present for three weeks. She reports productive cough and rhinorrhea with green mucus. Cough is worse in the morning   She denies sinus pain/pressure/fevers/chills/shortness of breath or wheezing.   Mucinex,dayquil and nyquil without much relief.   She tested negative for covid 19   She works in a day care   ROS: See pertinent positives and negatives per HPI.  Past Medical History:  Diagnosis Date   Hypertension     Past Surgical History:  Procedure Laterality Date   CESAREAN SECTION  78    Family History  Problem Relation Age of Onset   Alzheimer's disease Mother    Heart disease Father    Cirrhosis Sister        cryptogenic,  died during COVID INFECTION    Breast cancer Neg Hx        Current Outpatient Medications:    clobetasol ointment (TEMOVATE) 0.05 %, Apply 1 application topically 2 (two) times daily., Disp: 60 g, Rfl: 1   metoprolol tartrate (LOPRESSOR) 25 MG tablet, Take 1 tablet (25 mg total) by mouth daily., Disp: 90 tablet, Rfl: 1  EXAM:  VITALS per patient if applicable:  GENERAL: alert, oriented, appears well and in no acute distress  HEENT: atraumatic, conjunttiva clear, no obvious abnormalities on inspection of external nose and ears  NECK: normal movements of the head and  neck  LUNGS: on inspection no signs of respiratory distress, breathing rate appears normal, no obvious gross SOB, gasping or wheezing  CV: no obvious cyanosis  MS: moves all visible extremities without noticeable abnormality  PSYCH/NEURO: pleasant and cooperative, no obvious depression or anxiety, speech and thought processing grossly intact  ASSESSMENT AND PLAN:  Discussed the following assessment and plan:  1. Acute non-recurrent pansinusitis - Will treat due to duration of symptoms.  - Advised side effects of medication - Follow up if not improving in the next 2-3 days  - doxycycline (VIBRAMYCIN) 100 MG capsule; Take 1 capsule (100 mg total) by mouth 2 (two) times daily.  Dispense: 14 capsule; Refill: 0    I discussed the assessment and treatment plan with the patient. The patient was provided an opportunity to ask questions and all were answered. The patient agreed with the plan and demonstrated an understanding of the instructions.   The patient was advised to call back or seek an in-person evaluation if the symptoms worsen or if the condition fails to improve as anticipated.   Shirline Frees, NP

## 2021-03-29 NOTE — Telephone Encounter (Signed)
Pt had a telephone visit with morning with Dr. Kandee Keen at [place].

## 2021-11-25 ENCOUNTER — Other Ambulatory Visit: Payer: Self-pay | Admitting: Internal Medicine

## 2021-11-25 DIAGNOSIS — I10 Essential (primary) hypertension: Secondary | ICD-10-CM

## 2021-12-13 ENCOUNTER — Ambulatory Visit
Admission: EM | Admit: 2021-12-13 | Discharge: 2021-12-13 | Disposition: A | Discharge status: Home / Self Care | Payer: Medicare HMO | Attending: Emergency Medicine | Admitting: Emergency Medicine

## 2021-12-13 DIAGNOSIS — J0100 Acute maxillary sinusitis, unspecified: Secondary | ICD-10-CM

## 2021-12-13 LAB — POCT RAPID STREP A (OFFICE): Rapid Strep A Screen: NEGATIVE

## 2021-12-13 MED ORDER — AMOXICILLIN 875 MG PO TABS: 875.0000 mg | ORAL_TABLET | Freq: Two times a day (BID) | ORAL | 0 refills | Status: AC

## 2021-12-13 NOTE — ED Provider Notes (Signed)
Renaldo Fiddler    CSN: [id number] Arrival date & time: 12/13/21  1319      History   Chief Complaint Chief Complaint  Patient presents with   Sore Throat    HPI Elizabeth Hanna is a 66 y.o. female.  Patient presents with 6-day history of sore throat, sinus congestion, postnasal drip, cough occasionally productive of green sputum.  No fever, rash, chest pain, shortness of breath, vomiting, diarrhea, or other symptoms.  Treatment with Tylenol and NyQuil.  Her medical history includes hypertension and prediabetes.  The history is provided by the patient and medical records.    Past Medical History:  Diagnosis Date   Hypertension     Patient Active Problem List   Diagnosis Date Noted   Prediabetes 12/14/2017   Knee pain, left 12/12/2017   Hyperlipidemia LDL goal <100 06/02/2016   Overweight (BMI 25.0-29.9) 06/02/2016   Vitamin D deficiency 05/31/2015   Lichen sclerosus et atrophicus 02/28/2014   Essential hypertension 02/26/2014   Osteoarthritis of right hand 02/26/2014   Encounter for health maintenance examination with abnormal findings 02/26/2014    Past Surgical History:  Procedure Laterality Date   CESAREAN SECTION  1981    OB History   No obstetric history on file.      Home Medications    Prior to Admission medications   Medication Sig Start Date End Date Taking? Authorizing Provider  amoxicillin (AMOXIL) 875 MG tablet Take 1 tablet (875 mg total) by mouth 2 (two) times daily for 10 days. 12/13/21 12/23/21 Yes Mickie Bail, NP  clobetasol ointment (TEMOVATE) 0.05 % Apply 1 application topically 2 (two) times daily. 03/07/21   [name], MD  metoprolol tartrate (LOPRESSOR) 25 MG tablet Take 1 tablet by mouth once daily 11/27/21   [name], MD    Family History Family History  Problem Relation Age of Onset   Alzheimer's disease Mother    Heart disease Father    Cirrhosis Sister        cryptogenic,  died during COVID INFECTION     Breast cancer Neg Hx     Social History Social History   Tobacco Use   Smoking status: Former    Types: Cigarettes    Quit date: 06/29/1998    Years since quitting: 23.4   Smokeless tobacco: Never  Substance Use Topics   Alcohol use: Yes    Comment: rarely   Drug use: No     Allergies   Patient has no known allergies.   Review of Systems Review of Systems  Constitutional:  Negative for chills and fever.  HENT:  Positive for congestion, postnasal drip, rhinorrhea and sore throat. Negative for ear pain.   Respiratory:  Positive for cough. Negative for shortness of breath.   Cardiovascular:  Negative for chest pain and palpitations.  Gastrointestinal:  Negative for diarrhea and vomiting.  Skin:  Negative for color change and rash.  All other systems reviewed and are negative.    Physical Exam Triage Vital Signs ED Triage Vitals  Enc Vitals Group     BP      Pulse      Resp      Temp      Temp src      SpO2      Weight      Height      Head Circumference      Peak Flow      Pain Score  Pain Loc      Pain Edu?      Excl. in GC?    No data found.  Updated Vital Signs BP 134/88   Pulse 90   Temp 98.4 F (36.9 C)   Resp 18   SpO2 96%   Visual Acuity Right Eye Distance:   Left Eye Distance:   Bilateral Distance:    Right Eye Near:   Left Eye Near:    Bilateral Near:     Physical Exam Vitals and nursing note reviewed.  Constitutional:      General: She is not in acute distress.    Appearance: Normal appearance. She is well-developed. She is not ill-appearing.  HENT:     Right Ear: Tympanic membrane normal.     Left Ear: Tympanic membrane normal.     Nose: Congestion present.     Mouth/Throat:     Mouth: Mucous membranes are moist.     Pharynx: Posterior oropharyngeal erythema present.  Cardiovascular:     Rate and Rhythm: Normal rate and regular rhythm.     Heart sounds: Normal heart sounds.  Pulmonary:     Effort: Pulmonary effort  is normal. No respiratory distress.     Breath sounds: Normal breath sounds.  Musculoskeletal:     Cervical back: Neck supple.  Skin:    General: Skin is warm and dry.  Neurological:     Mental Status: She is alert.  Psychiatric:        Mood and Affect: Mood normal.        Behavior: Behavior normal.      UC Treatments / Results  Labs (all labs ordered are listed, but only abnormal results are displayed) Labs Reviewed  POCT RAPID STREP A (OFFICE)    EKG   Radiology No results found.  Procedures Procedures (including critical care time)  Medications Ordered in UC Medications - No data to display  Initial Impression / Assessment and Plan / UC Course  I have reviewed the triage vital signs and the nursing notes.  Pertinent labs & imaging results that were available during my care of the patient were reviewed by me and considered in my medical decision making (see chart for details).    Acute sinusitis.  Rapid strep negative.  Patient has been symptomatic for 6 days and is not improving with OTC treatment.  Treating with amoxicillin.  Instructed patient to follow up with her PCP if her symptoms are not improving.  She agrees to plan of care.    Final Clinical Impressions(s) / UC Diagnoses   Final diagnoses:  Acute non-recurrent maxillary sinusitis     Discharge Instructions      Take the amoxicillin as directed for sinus infection.  Follow up with your primary care provider if your symptoms are not improving.        ED Prescriptions     Medication Sig Dispense Auth. Provider   amoxicillin (AMOXIL) 875 MG tablet Take 1 tablet (875 mg total) by mouth 2 (two) times daily for 10 days. 20 tablet Mickie Bail, NP      PDMP not reviewed this encounter.   Mickie Bail, NP 12/13/21 1349

## 2021-12-13 NOTE — ED Triage Notes (Signed)
Patient presents to Urgent Care with complaints of sore throat x 4 days. Taking nyquil.

## 2021-12-13 NOTE — Discharge Instructions (Addendum)
Take the amoxicillin as directed for sinus infection.  Follow up with your primary care provider if your symptoms are not improving.    

## 2021-12-18 ENCOUNTER — Ambulatory Visit: Payer: Medicare HMO | Admitting: Family Medicine

## 2022-02-15 ENCOUNTER — Other Ambulatory Visit: Payer: Self-pay | Admitting: Internal Medicine

## 2022-02-15 DIAGNOSIS — I10 Essential (primary) hypertension: Secondary | ICD-10-CM

## 2022-03-13 ENCOUNTER — Ambulatory Visit (INDEPENDENT_AMBULATORY_CARE_PROVIDER_SITE_OTHER): Payer: Medicare HMO | Admitting: Internal Medicine

## 2022-03-13 ENCOUNTER — Encounter: Payer: Self-pay | Admitting: Internal Medicine

## 2022-03-13 VITALS — BP 128/82 | HR 79 | Temp 97.6°F | Ht 62.5 in | Wt 143.2 lb

## 2022-03-13 DIAGNOSIS — Z0001 Encounter for general adult medical examination with abnormal findings: Secondary | ICD-10-CM

## 2022-03-13 DIAGNOSIS — E785 Hyperlipidemia, unspecified: Secondary | ICD-10-CM | POA: Diagnosis not present

## 2022-03-13 DIAGNOSIS — Z1231 Encounter for screening mammogram for malignant neoplasm of breast: Secondary | ICD-10-CM

## 2022-03-13 DIAGNOSIS — I10 Essential (primary) hypertension: Secondary | ICD-10-CM

## 2022-03-13 DIAGNOSIS — R7301 Impaired fasting glucose: Secondary | ICD-10-CM | POA: Diagnosis not present

## 2022-03-13 DIAGNOSIS — R5383 Other fatigue: Secondary | ICD-10-CM

## 2022-03-13 LAB — MICROALBUMIN / CREATININE URINE RATIO
Creatinine,U: 28.5 mg/dL
Microalb Creat Ratio: 2.5 mg/g (ref 0.0–30.0)
Microalb, Ur: <0.7 mg/dL (ref 0.0–1.9)

## 2022-03-13 NOTE — Patient Instructions (Addendum)
Your annual mammogram has  been ordered.  Please call [facility] to call to make your appointments  .  The phone number for [facility] is  [phone].  Their location has changed so be sure to ask /

## 2022-03-13 NOTE — Assessment & Plan Note (Signed)

## 2022-03-13 NOTE — Progress Notes (Signed)
Patient ID: Elizabeth Hanna, female    DOB: Sep 28, 1955  Age: 66 y.o. MRN: [id number]  The patient is here for annual preventive examination and management of other chronic and acute problems.   The risk factors are reflected in the social history.  The roster of all physicians providing medical care to patient - is listed in the Snapshot section of the chart.  Activities of daily living:  The patient is 100% independent in all ADLs: dressing, toileting, feeding as well as independent mobility  Home safety : The patient has smoke detectors in the home. They wear seatbelts.  There are no firearms at home. There is no violence in the home.   There is no risks for hepatitis, STDs or HIV. There is no   history of blood transfusion. They have no travel history to infectious disease endemic areas of the world.  The patient has seen their dentist in the last six month. They have seen their eye doctor in the last year. They admit to slight hearing difficulty with regard to whispered voices and some television programs.  They have deferred audiologic testing in the last year.  They do not  have excessive sun exposure. Discussed the need for sun protection: hats, long sleeves and use of sunscreen if there is significant sun exposure.   Diet: the importance of a healthy diet is discussed. They do have a healthy diet.  The benefits of regular aerobic exercise were discussed. She walks 4 times per week ,  20 minutes.   Depression screen: there are no signs or vegative symptoms of depression- irritability, change in appetite, anhedonia, sadness/tearfullness.  Cognitive assessment: the patient manages all their financial and personal affairs and is actively engaged. They could relate day,date,year and events; recalled 2/3 objects at 3 minutes; performed clock-face test normally.  The following portions of the patient's history were reviewed and updated as appropriate: allergies, current medications, past  family history, past medical history,  past surgical history, past social history  and problem list.  Visual acuity was not assessed per patient preference since she has regular follow up with her ophthalmologist. Hearing and body mass index were assessed and reviewed.   During the course of the visit the patient was educated and counseled about appropriate screening and preventive services including : fall prevention , diabetes screening, nutrition counseling, colorectal cancer screening, and recommended immunizations.    CC: The primary encounter diagnosis was Essential hypertension. Diagnoses of Hyperlipidemia LDL goal <100, Other fatigue, Impaired fasting glucose, Encounter for screening mammogram for malignant neoplasm of breast, and Encounter for health maintenance examination with abnormal findings were also pertinent to this visit.  History Deetya has a past medical history of Hypertension.   She has a past surgical history that includes Cesarean section (1981).   Her family history includes Alzheimer's disease in her mother; Cirrhosis in her sister; Heart disease in her father.She reports that she quit smoking about 23 years ago. Her smoking use included cigarettes. She has never used smokeless tobacco. She reports current alcohol use. She reports that she does not use drugs.  Outpatient Medications Prior to Visit  Medication Sig Dispense Refill   clobetasol ointment (TEMOVATE) 3.00 % Apply 1 application topically 2 (two) times daily. 60 g 1   metoprolol tartrate (LOPRESSOR) 25 MG tablet Take 1 tablet by mouth once daily 90 tablet 0   No facility-administered medications prior to visit.    Review of Systems  Patient denies headache, fevers, malaise, unintentional weight  loss, skin rash, eye pain, sinus congestion and sinus pain, sore throat, dysphagia,  hemoptysis , cough, dyspnea, wheezing, chest pain, palpitations, orthopnea, edema, abdominal pain, nausea, melena, diarrhea,  constipation, flank pain, dysuria, hematuria, urinary  Frequency, nocturia, numbness, tingling, seizures,  Focal weakness, Loss of consciousness,  Tremor, insomnia, depression, anxiety, and suicidal ideation.     Objective:  BP 128/82 (BP Location: Left Arm, Patient Position: Sitting, Cuff Size: Normal)   Pulse 79   Temp 97.6 F (36.4 C) (Oral)   Ht 5' 2.5" (1.588 m)   Wt 143 lb 3.2 oz (65 kg)   SpO2 96%   BMI 25.77 kg/m   Physical Exam . General appearance: alert, cooperative and appears stated age Head: Normocephalic, without obvious abnormality, atraumatic Eyes: conjunctivae/corneas clear. PERRL, EOM's intact. Fundi benign. Ears: normal TM's and external ear canals both ears Nose: Nares normal. Septum midline. Mucosa normal. No drainage or sinus tenderness. Throat: lips, mucosa, and tongue normal; teeth and gums normal Neck: no adenopathy, no carotid bruit, no JVD, supple, symmetrical, trachea midline and thyroid not enlarged, symmetric, no tenderness/mass/nodules Lungs: clear to auscultation bilaterally Breasts: normal appearance, no masses or tenderness Heart: regular rate and rhythm, S1, S2 normal, no murmur, click, rub or gallop Abdomen: soft, non-tender; bowel sounds normal; no masses,  no organomegaly Extremities: extremities normal, atraumatic, no cyanosis or edema Pulses: 2+ and symmetric Skin: Skin color, texture, turgor normal. No rashes or lesions Neurologic: Alert and oriented X 3, normal strength and tone. Normal symmetric reflexes. Normal coordination and gait.     Assessment & Plan:   Problem List Items Addressed This Visit     Encounter for health maintenance examination with abnormal findings    age appropriate education and counseling updated, referrals for preventative services and immunizations addressed, dietary and smoking counseling addressed, most recent labs reviewed.  I have personally reviewed and have noted:   1) the patient's medical and social  history 2) The pt's use of alcohol, tobacco, and illicit drugs 3) The patient's current medications and supplements 4) Functional ability including ADL's, fall risk, home safety risk, hearing and visual impairment 5) Diet and physical activities 6) Evidence for depression or mood disorder 7) The patient's height, weight, and BMI have been recorded in the chart   I have made referrals, and provided counseling and education based on review of the above      Essential hypertension - Primary    Well controlled on current regimen of metoprolol .  , no changes today.      Relevant Orders   Urine Microalbumin w/creat. ratio   Comp Met (CMET)   Hyperlipidemia LDL goal <100   Relevant Orders   Lipid Profile   Direct LDL   Other Visit Diagnoses     Other fatigue       Relevant Orders   CBC with Differential/Platelet   TSH   Impaired fasting glucose       Relevant Orders   Comp Met (CMET)   HgB A1c   Encounter for screening mammogram for malignant neoplasm of breast       Relevant Orders   MM 3D SCREEN BREAST BILATERAL       I am having Kimble C. Dahir maintain her clobetasol ointment and metoprolol tartrate.  No orders of the defined types were placed in this encounter.   There are no discontinued medications.  Follow-up: No follow-ups on file.   Crecencio Mc, MD

## 2022-03-13 NOTE — Assessment & Plan Note (Signed)
Well controlled on current regimen of metoprolol .  , no changes today.

## 2022-03-14 LAB — CBC WITH DIFFERENTIAL/PLATELET
Basophils Absolute: 0.1 10*3/uL (ref 0.0–0.1)
Basophils Relative: 1.1 % (ref 0.0–3.0)
Eosinophils Absolute: 0.1 10*3/uL (ref 0.0–0.7)
Eosinophils Relative: 1 % (ref 0.0–5.0)
HCT: 42.1 % (ref 36.0–46.0)
Hemoglobin: 14.1 g/dL (ref 12.0–15.0)
Lymphocytes Relative: 33.8 % (ref 12.0–46.0)
Lymphs Abs: 2.3 10*3/uL (ref 0.7–4.0)
MCHC: 33.6 g/dL (ref 30.0–36.0)
MCV: 93 fl (ref 78.0–100.0)
Monocytes Absolute: 0.4 10*3/uL (ref 0.1–1.0)
Monocytes Relative: 6.3 % (ref 3.0–12.0)
Neutro Abs: 3.9 10*3/uL (ref 1.4–7.7)
Neutrophils Relative %: 57.8 % (ref 43.0–77.0)
Platelets: 247 10*3/uL (ref 150.0–400.0)
RBC: 4.52 Mil/uL (ref 3.87–5.11)
RDW: 12.3 % (ref 11.5–15.5)
WBC: 6.7 10*3/uL (ref 4.0–10.5)

## 2022-03-14 LAB — LIPID PANEL
Cholesterol: 209 mg/dL — ABNORMAL HIGH (ref 0–200)
HDL: 76.8 mg/dL (ref >39.00)
LDL Cholesterol: 121 mg/dL — ABNORMAL HIGH (ref 0–99)
NonHDL: 131.99
Total CHOL/HDL Ratio: 3
Triglycerides: 53 mg/dL (ref 0.0–149.0)
VLDL: 10.6 mg/dL (ref 0.0–40.0)

## 2022-03-14 LAB — COMPREHENSIVE METABOLIC PANEL
ALT: 20 U/L (ref 0–35)
AST: 18 U/L (ref 0–37)
Albumin: 4.7 g/dL (ref 3.5–5.2)
Alkaline Phosphatase: 50 U/L (ref 39–117)
BUN: 16 mg/dL (ref 6–23)
CO2: 30 mEq/L (ref 19–32)
Calcium: 10 mg/dL (ref 8.4–10.5)
Chloride: 102 mEq/L (ref 96–112)
Creatinine, Ser: 0.56 mg/dL (ref 0.40–1.20)
GFR: 94.4 mL/min (ref >60.00)
Glucose, Bld: 89 mg/dL (ref 70–99)
Potassium: 4.3 mEq/L (ref 3.5–5.1)
Sodium: 139 mEq/L (ref 135–145)
Total Bilirubin: 0.7 mg/dL (ref 0.2–1.2)
Total Protein: 6.8 g/dL (ref 6.0–8.3)

## 2022-03-14 LAB — HEMOGLOBIN A1C: Hgb A1c MFr Bld: 6.1 % (ref 4.6–6.5)

## 2022-03-14 LAB — LDL CHOLESTEROL, DIRECT: Direct LDL: 122 mg/dL

## 2022-03-14 LAB — TSH: TSH: 0.7 u[IU]/mL (ref 0.35–5.50)

## 2022-03-28 ENCOUNTER — Other Ambulatory Visit: Payer: Self-pay | Admitting: Internal Medicine

## 2022-04-10 ENCOUNTER — Telehealth: Payer: Self-pay | Admitting: Internal Medicine

## 2022-04-10 NOTE — Telephone Encounter (Signed)
Copied from CRM [id number]. Topic: Medicare AWV >> Apr 10, 2022 11:25 AM Payton Doughty wrote: Reason for CRM: Attempted to schedule AWV. Unable to LVM.  Will try at later time.

## 2022-04-23 ENCOUNTER — Ambulatory Visit
Admission: RE | Admit: 2022-04-23 | Discharge: 2022-04-23 | Disposition: A | Discharge status: Home / Self Care | Payer: Medicare HMO | Source: Ambulatory Visit | Attending: Internal Medicine | Admitting: Internal Medicine

## 2022-04-23 DIAGNOSIS — Z1231 Encounter for screening mammogram for malignant neoplasm of breast: Secondary | ICD-10-CM | POA: Insufficient documentation

## 2022-05-19 ENCOUNTER — Other Ambulatory Visit: Payer: Self-pay | Admitting: Internal Medicine

## 2022-05-19 DIAGNOSIS — I10 Essential (primary) hypertension: Secondary | ICD-10-CM

## 2022-08-16 ENCOUNTER — Telehealth: Payer: Self-pay | Admitting: Family

## 2022-08-16 DIAGNOSIS — I10 Essential (primary) hypertension: Secondary | ICD-10-CM

## 2022-08-20 ENCOUNTER — Telehealth: Payer: Self-pay | Admitting: Internal Medicine

## 2022-08-20 ENCOUNTER — Other Ambulatory Visit: Payer: Self-pay

## 2022-08-20 DIAGNOSIS — I10 Essential (primary) hypertension: Secondary | ICD-10-CM

## 2022-08-20 MED ORDER — METOPROLOL TARTRATE 25 MG PO TABS
25.0000 mg | ORAL_TABLET | Freq: Every day | ORAL | 1 refills | Status: DC
Start: 2022-08-20 — End: 2023-02-11

## 2022-08-20 NOTE — Telephone Encounter (Signed)
Patient called and pharmacy has not received refill.

## 2022-08-20 NOTE — Telephone Encounter (Signed)
Medication refilled

## 2022-08-20 NOTE — Telephone Encounter (Signed)
Prescription Request  08/20/2022  LOV: 03/13/2022  What is the name of the medication or equipment? metoprolol tartrate (LOPRESSOR) 25 MG tablet  Have you contacted your pharmacy to request a refill? Yes   Which pharmacy would you like this sent to?  [place], [place] - [address] [place] [address] Phone: [phone] Fax: [phone]    Patient notified that their request is being sent to the clinical staff for review and that they should receive a response within 2 business days.   Please advise at Mobile [phone] (mobile)

## 2023-01-23 IMAGING — MG DIGITAL DIAGNOSTIC BILAT W/ TOMO W/ CAD
8 of 14 series · 8 of 40 positions shown · non-contrast
Comparison: Previous exam(s).

CLINICAL DATA: Palpable abnormality in the LEFT breast noted on
recent physical exam in the 9 o'clock location 0.5 centimeters from
the nipple.

EXAM:
DIGITAL DIAGNOSTIC BILATERAL MAMMOGRAM WITH TOMOSYNTHESIS AND CAD;
ULTRASOUND LEFT BREAST LIMITED
TECHNIQUE: Bilateral digital diagnostic mammography and breast tomosynthesis
was performed. The images were evaluated with computer-aided
detection.; Targeted ultrasound examination of the left breast was
performed.

[R CC synth-2D]
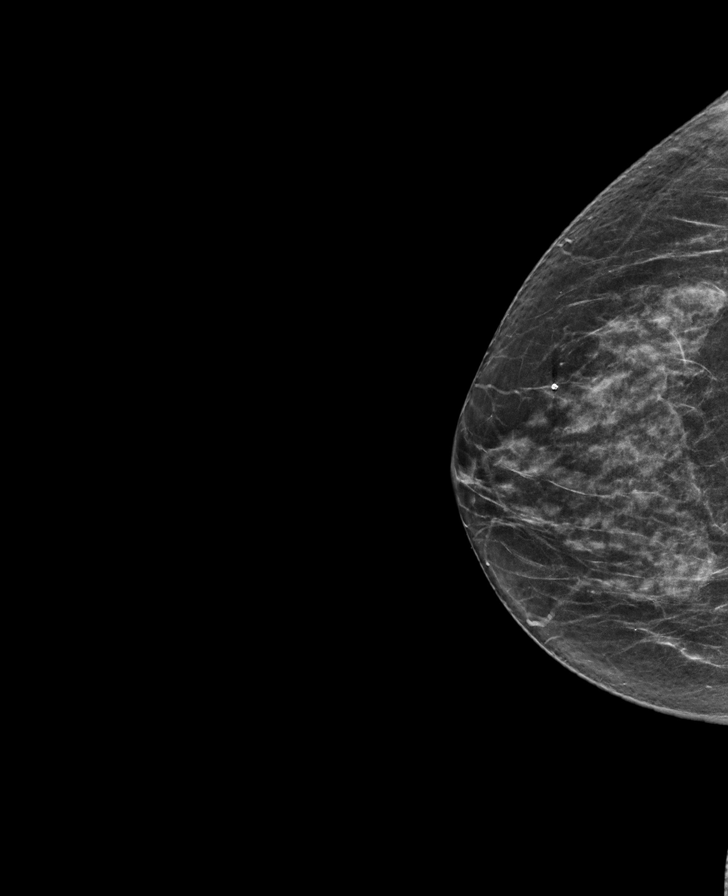

[L MLO synth-2D]
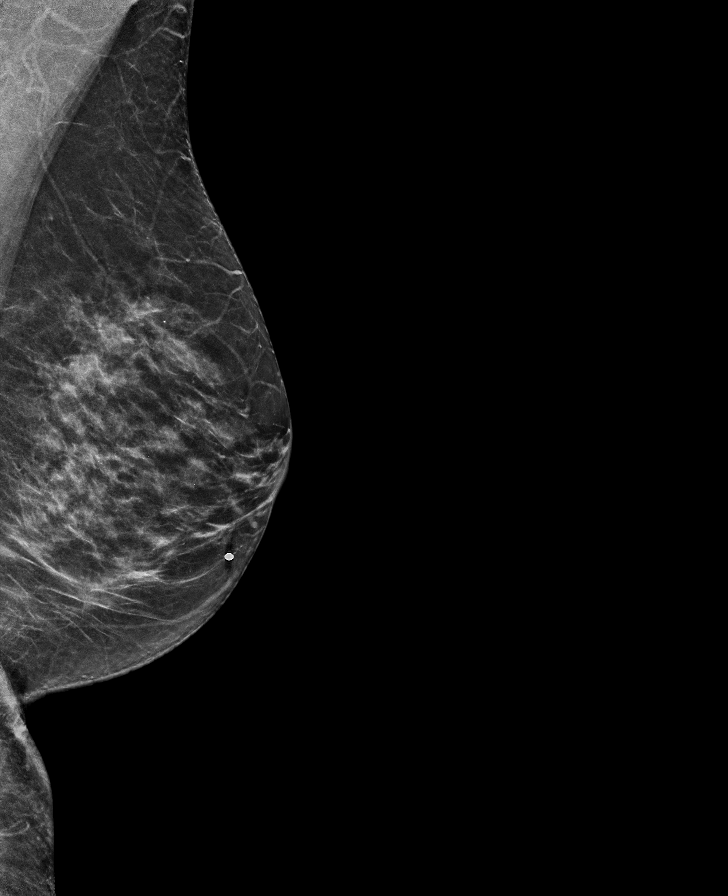

[L CC synth-2D (1 of 3)]
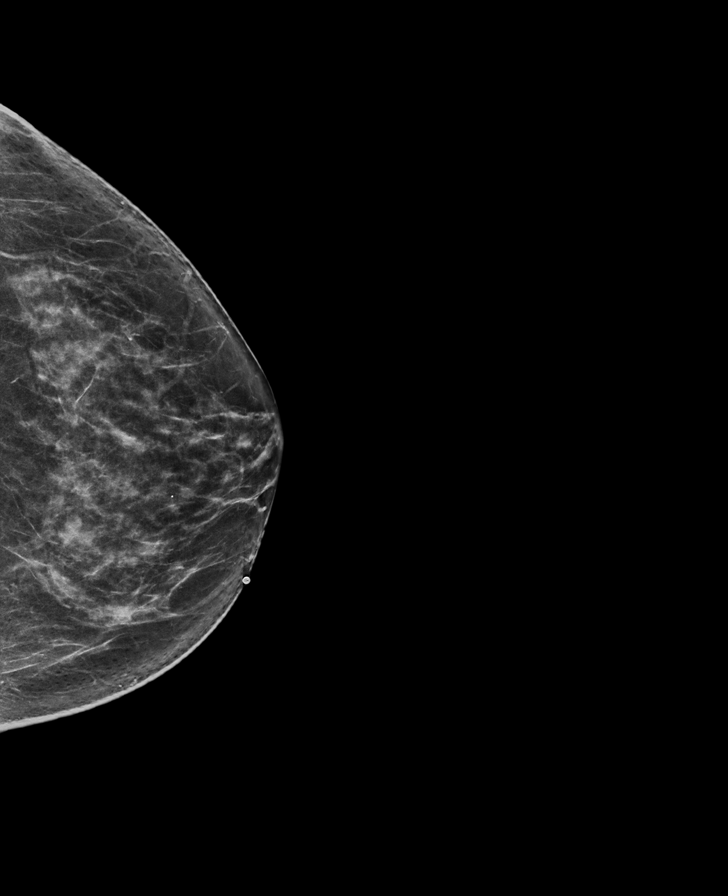

[L CC synth-2D (2 of 3)]
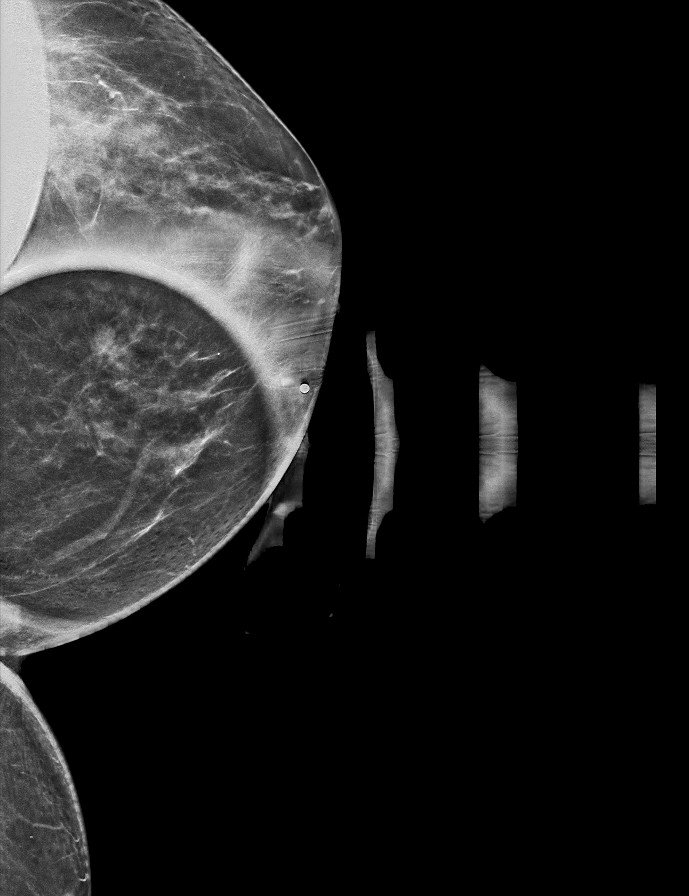

[L ML synth-2D]
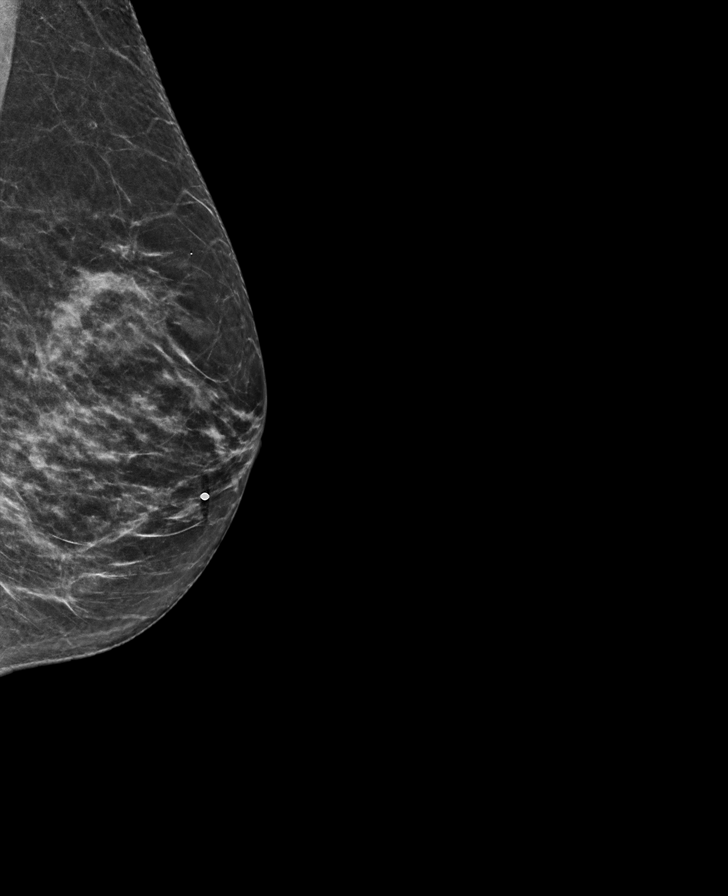

[R MLO synth-2D]
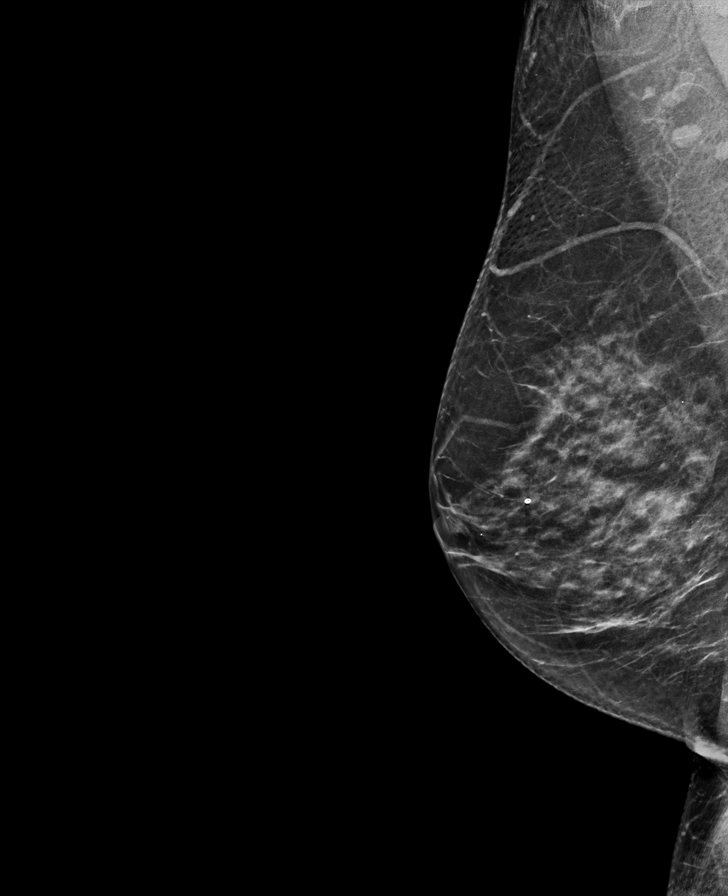

[L CC synth-2D (3 of 3)]
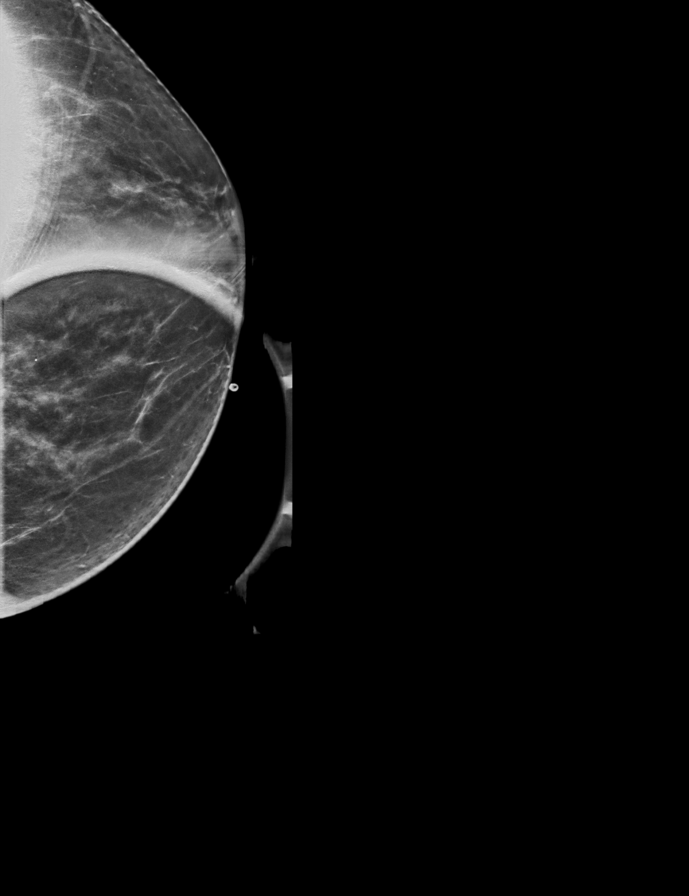

[R MLO tomo · tomo slice 33/65.0]
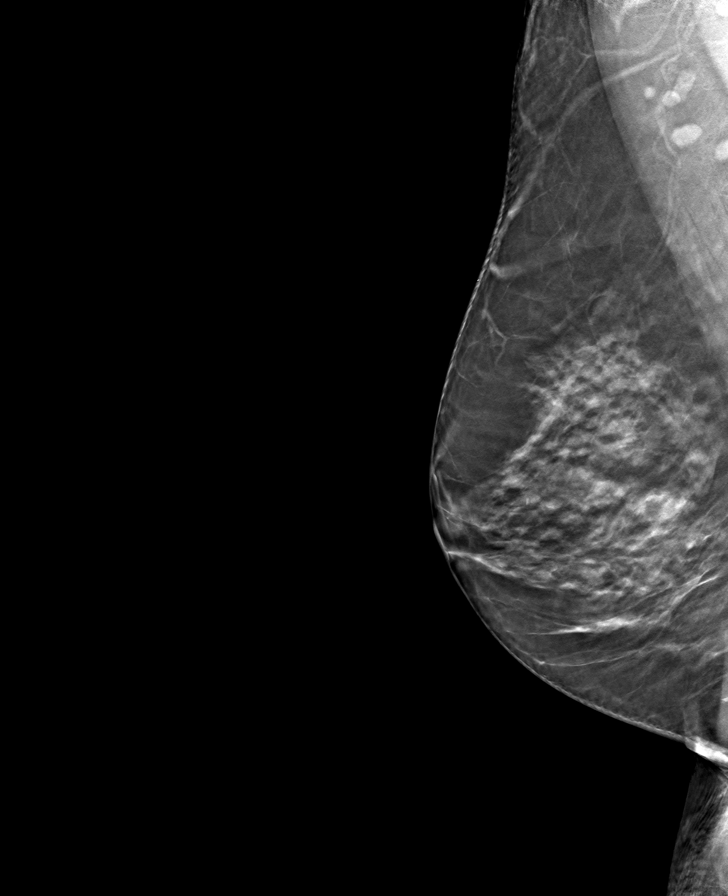

[8 of 40 positions shown; findings below may reference images not displayed]

ACR Breast Density Category c: The breast tissue is heterogeneously
dense, which may obscure small masses.
FINDINGS: RIGHT breast is negative.

Within the MEDIAL portion of the LEFT breast, there is a partially
circumscribed mass further evaluated with spot compression views.
Mass is persistent on spot compression views, best seen on
craniocaudal projection. Spot tangential view is performed in the
area of patient's concern. In this region normal appearing
fibroglandular tissue is imaged.

On physical exam, I palpate soft nodularity without discrete mass in
the LOWER INNER QUADRANT of the LEFT breast. I palpate no discrete
mass in the retroareolar region of the LEFT breast.

Targeted ultrasound is performed, showing cyst in the 8 o'clock
location of the LEFT breast 3 centimeters from the nipple, posterior
in location and measuring 0.5 x 0.5 x 0.6 centimeters. No other
abnormalities are identified in the LOWER INNER QUADRANT of the LEFT
breast.
IMPRESSION: Benign cyst in the LEFT breast. No mammographic or ultrasound
evidence for malignancy.

RECOMMENDATION:
Screening mammogram in one year.(Code:RH-E-NP2)

I have discussed the findings and recommendations with the patient.
If applicable, a reminder letter will be sent to the patient
regarding the next appointment.

BI-RADS CATEGORY  2: Benign.

## 2023-02-10 ENCOUNTER — Other Ambulatory Visit: Payer: Self-pay | Admitting: Internal Medicine

## 2023-02-10 DIAGNOSIS — I10 Essential (primary) hypertension: Secondary | ICD-10-CM

## 2023-03-18 ENCOUNTER — Encounter: Payer: Self-pay | Admitting: Internal Medicine

## 2023-03-18 ENCOUNTER — Ambulatory Visit (INDEPENDENT_AMBULATORY_CARE_PROVIDER_SITE_OTHER): Payer: Medicare HMO | Admitting: Internal Medicine

## 2023-03-18 VITALS — BP 122/76 | HR 81 | Ht 62.5 in | Wt 156.6 lb

## 2023-03-18 DIAGNOSIS — Z1231 Encounter for screening mammogram for malignant neoplasm of breast: Secondary | ICD-10-CM

## 2023-03-18 DIAGNOSIS — E785 Hyperlipidemia, unspecified: Secondary | ICD-10-CM | POA: Diagnosis not present

## 2023-03-18 DIAGNOSIS — Z1211 Encounter for screening for malignant neoplasm of colon: Secondary | ICD-10-CM

## 2023-03-18 DIAGNOSIS — Z0001 Encounter for general adult medical examination with abnormal findings: Secondary | ICD-10-CM

## 2023-03-18 DIAGNOSIS — L900 Lichen sclerosus et atrophicus: Secondary | ICD-10-CM

## 2023-03-18 DIAGNOSIS — I10 Essential (primary) hypertension: Secondary | ICD-10-CM

## 2023-03-18 DIAGNOSIS — E663 Overweight: Secondary | ICD-10-CM

## 2023-03-18 DIAGNOSIS — Z0000 Encounter for general adult medical examination without abnormal findings: Secondary | ICD-10-CM

## 2023-03-18 MED ORDER — CLOBETASOL PROPIONATE 0.05 % EX OINT: TOPICAL_OINTMENT | Freq: Two times a day (BID) | CUTANEOUS | 2 refills | Status: DC

## 2023-03-18 MED ORDER — METOPROLOL TARTRATE 25 MG PO TABS: 25.0000 mg | ORAL_TABLET | Freq: Every day | ORAL | 1 refills | Status: DC

## 2023-03-18 NOTE — Assessment & Plan Note (Signed)
I have addressed  BMI and recommended wt loss of 10% of body weight over the next 6 months using a low fat, low starch, high protein  fruit/vegetable based Mediterranean diet .  She ihas recently rejoined a gym and is  participating in over 30 minutes of aerobic exercise a minimum of 5 days per week.

## 2023-03-18 NOTE — Assessment & Plan Note (Addendum)
Early, suggested by history ,  continues to have rapid resolution with SEVERAL days of clobetasol use.  Refills given

## 2023-03-18 NOTE — Progress Notes (Addendum)
Patient ID: Elizabeth Hanna, female    DOB: 08/04/1955  Age: 67 y.o. MRN: [id number]  The patient is here for annual preventive examination and management of other chronic and acute problems.   The risk factors are reflected in the social history.   The roster of all physicians providing medical care to patient - is listed in the Snapshot section of the chart.   Activities of daily living:  The patient is 100% independent in all ADLs: dressing, toileting, feeding as well as independent mobility   Home safety : The patient has smoke detectors in the home. They wear seatbelts.  There are no unsecured firearms at home. There is no violence in the home.    There is no risks for hepatitis, STDs or HIV. There is no   history of blood transfusion. They have no travel history to infectious disease endemic areas of the world.   The patient has seen their dentist in the last six month. They have seen their eye doctor in the last year. The patinet  denies slight hearing difficulty with regard to whispered voices and some television programs.  They have deferred audiologic testing in the last year.  They do not  have excessive sun exposure. Discussed the need for sun protection: hats, long sleeves and use of sunscreen if there is significant sun exposure.    Diet: the importance of a healthy diet is discussed. They do have a healthy diet.   The benefits of regular aerobic exercise were discussed. The patient  exercises  3 to 5 days per week  for  60 minutes.    Depression screen: there are no signs or vegative symptoms of depression- irritability, change in appetite, anhedonia, sadness/tearfullness.   The following portions of the patient's history were reviewed and updated as appropriate: allergies, current medications, past family history, past medical history,  past surgical history, past social history  and problem list.   Visual acuity was not assessed per patient preference since the patient has  regular follow up with an  ophthalmologist. Hearing and body mass index were assessed and reviewed.    During the course of the visit the patient was educated and counseled about appropriate screening and preventive services including : fall prevention , diabetes screening, nutrition counseling, colorectal cancer screening, and recommended immunizations.    Chief Complaint:  None   Review of Symptoms  Patient denies headache, fevers, malaise, unintentional weight loss, skin rash, eye pain, sinus congestion and sinus pain, sore throat, dysphagia,  hemoptysis , cough, dyspnea, wheezing, chest pain, palpitations, orthopnea, edema, abdominal pain, nausea, melena, diarrhea, constipation, flank pain, dysuria, hematuria, urinary  Frequency, nocturia, numbness, tingling, seizures,  Focal weakness, Loss of consciousness,  Tremor, insomnia, depression, anxiety, and suicidal ideation.    Physical Exam:  BP 122/76   Pulse 81   Ht 5' 2.5" (1.588 m)   Wt 156 lb 9.6 oz (71 kg)   SpO2 99%   BMI 28.19 kg/m    Physical Exam Vitals reviewed.  Constitutional:      General: She is not in acute distress.    Appearance: Normal appearance. She is well-developed and normal weight. She is not ill-appearing, toxic-appearing or diaphoretic.  HENT:     Head: Normocephalic.     Right Ear: Tympanic membrane, ear canal and external ear normal. There is no impacted cerumen.     Left Ear: Tympanic membrane, ear canal and external ear normal. There is no impacted cerumen.  Nose: Nose normal.     Mouth/Throat:     Mouth: Mucous membranes are moist.     Pharynx: Oropharynx is clear.  Eyes:     General: No scleral icterus.       Right eye: No discharge.        Left eye: No discharge.     Conjunctiva/sclera: Conjunctivae normal.     Pupils: Pupils are equal, round, and reactive to light.  Neck:     Thyroid: No thyromegaly.     Vascular: No carotid bruit or JVD.  Cardiovascular:     Rate and Rhythm: Normal  rate and regular rhythm.     Heart sounds: Normal heart sounds.  Pulmonary:     Effort: Pulmonary effort is normal. No respiratory distress.     Breath sounds: Normal breath sounds.  Chest:  Breasts:    Breasts are symmetrical.     Right: Normal. No swelling, inverted nipple, mass, nipple discharge, skin change or tenderness.     Left: Normal. No swelling, inverted nipple, mass, nipple discharge, skin change or tenderness.  Abdominal:     General: Bowel sounds are normal.     Palpations: Abdomen is soft. There is no mass.     Tenderness: There is no abdominal tenderness. There is no guarding or rebound.  Musculoskeletal:        General: Normal range of motion.     Cervical back: Normal range of motion and neck supple.  Lymphadenopathy:     Cervical: No cervical adenopathy.     Upper Body:     Right upper body: No supraclavicular, axillary or pectoral adenopathy.     Left upper body: No supraclavicular, axillary or pectoral adenopathy.  Skin:    General: Skin is warm and dry.  Neurological:     General: No focal deficit present.     Mental Status: She is alert and oriented to person, place, and time. Mental status is at baseline.  Psychiatric:        Mood and Affect: Mood normal.        Behavior: Behavior normal.        Thought Content: Thought content normal.        Judgment: Judgment normal.    Assessment and Plan: Preventative health care Assessment & Plan: age appropriate education and counseling updated, referrals for preventative services and immunizations addressed, dietary and smoking counseling addressed, most recent labs reviewed.  I have personally reviewed and have noted:   1) the patient's medical and social history 2) The pt's use of alcohol, tobacco, and illicit drugs 3) The patient's current medications and supplements 4) Functional ability including ADL's, fall risk, home safety risk, hearing and visual impairment 5) Diet and physical activities 6) Evidence  for depression or mood disorder 7) The patient's height, weight, and BMI have been recorded in the chart   I have made referrals, and provided counseling and education based on review of the above    Essential hypertension Assessment & Plan: Well controlled on current regimen of metoprolol  25 mg bid.  no changes today.  Orders: -     Metoprolol Tartrate; Take 1 tablet (25 mg total) by mouth daily.  Dispense: 90 tablet; Refill: 1 -     Comprehensive metabolic panel -     Microalbumin / creatinine urine ratio  Hyperlipidemia LDL goal <100 Assessment & Plan: Based on current lipid profile, the risk of clinically significant CAD is 9% over the next 10 years, using  the [facility] Cardiac risk calculator. Repeat in one year   Lab Results  Component Value Date   CHOL 230 (H) 03/18/2023   HDL 74.30 03/18/2023   LDLCALC 141 (H) 03/18/2023   LDLDIRECT 142.0 03/18/2023   TRIG 75.0 03/18/2023   CHOLHDL 3 03/18/2023     Orders: -     Hemoglobin A1c -     LDL cholesterol, direct -     Lipid panel  Overweight (BMI 25.0-29.9) Assessment & Plan: I have addressed  BMI and recommended wt loss of 10% of body weight over the next 6 months using a low fat, low starch, high protein  fruit/vegetable based Mediterranean diet .  She ihas recently rejoined a gym and is  participating in over 30 minutes of aerobic exercise a minimum of 5 days per week.    Orders: -     TSH -     CBC with Differential/Platelet -     Hemoglobin A1c  Colon cancer screening -     Cologuard  Encounter for screening mammogram for malignant neoplasm of breast -     3D Screening Mammogram, Left and Right; Future  Lichen sclerosus et atrophicus Assessment & Plan: Early, suggested by history ,  continues to have rapid resolution with SEVERAL days of clobetasol use.  Refills given    Other orders -     Clobetasol Propionate; Apply topically 2 (two) times daily. APPLY TOPICALLY TWICE DAILY S NEEDED FOR VAGINAL IRRITATION   Dispense: 60 g; Refill: 2    No follow-ups on file.  [name], MD

## 2023-03-18 NOTE — Patient Instructions (Addendum)
Your annual mammogram has been ordered.  You are encouraged (required) to call to make your appointment at [place]  [phone]   I will initiate the order for your colon cancer screening  Test, the one called  Cologuard.  It will be delivered to your house, and you will send off a stool sample in the envelope it provides.   CLOBETASOL OINTMENT REFILLED

## 2023-03-18 NOTE — Assessment & Plan Note (Signed)

## 2023-03-18 NOTE — Assessment & Plan Note (Signed)
Well controlled on current regimen of metoprolol  25 mg bid.  no changes today.

## 2023-03-19 LAB — LIPID PANEL
Cholesterol: 230 mg/dL — ABNORMAL HIGH (ref 0–200)
HDL: 74.3 mg/dL (ref >39.00)
LDL Cholesterol: 141 mg/dL — ABNORMAL HIGH (ref 0–99)
NonHDL: 155.64
Total CHOL/HDL Ratio: 3
Triglycerides: 75 mg/dL (ref 0.0–149.0)
VLDL: 15 mg/dL (ref 0.0–40.0)

## 2023-03-19 LAB — CBC WITH DIFFERENTIAL/PLATELET
Basophils Absolute: 0.1 10*3/uL (ref 0.0–0.1)
Basophils Relative: 1.3 % (ref 0.0–3.0)
Eosinophils Absolute: 0.1 10*3/uL (ref 0.0–0.7)
Eosinophils Relative: 0.7 % (ref 0.0–5.0)
HCT: 43.9 % (ref 36.0–46.0)
Hemoglobin: 14.3 g/dL (ref 12.0–15.0)
Lymphocytes Relative: 35.5 % (ref 12.0–46.0)
Lymphs Abs: 2.5 10*3/uL (ref 0.7–4.0)
MCHC: 32.5 g/dL (ref 30.0–36.0)
MCV: 94.4 fL (ref 78.0–100.0)
Monocytes Absolute: 0.5 10*3/uL (ref 0.1–1.0)
Monocytes Relative: 6.3 % (ref 3.0–12.0)
Neutro Abs: 4 10*3/uL (ref 1.4–7.7)
Neutrophils Relative %: 56.2 % (ref 43.0–77.0)
Platelets: 262 10*3/uL (ref 150.0–400.0)
RBC: 4.65 Mil/uL (ref 3.87–5.11)
RDW: 12.9 % (ref 11.5–15.5)
WBC: 7.2 10*3/uL (ref 4.0–10.5)

## 2023-03-19 LAB — HEMOGLOBIN A1C: Hgb A1c MFr Bld: 6 % (ref 4.6–6.5)

## 2023-03-19 LAB — COMPREHENSIVE METABOLIC PANEL
ALT: 18 U/L (ref 0–35)
AST: 17 U/L (ref 0–37)
Albumin: 4.7 g/dL (ref 3.5–5.2)
Alkaline Phosphatase: 52 U/L (ref 39–117)
BUN: 13 mg/dL (ref 6–23)
CO2: 27 meq/L (ref 19–32)
Calcium: 10 mg/dL (ref 8.4–10.5)
Chloride: 104 meq/L (ref 96–112)
Creatinine, Ser: 0.64 mg/dL (ref 0.40–1.20)
GFR: 90.76 mL/min (ref >60.00)
Glucose, Bld: 89 mg/dL (ref 70–99)
Potassium: 3.9 meq/L (ref 3.5–5.1)
Sodium: 141 meq/L (ref 135–145)
Total Bilirubin: 0.6 mg/dL (ref 0.2–1.2)
Total Protein: 7.2 g/dL (ref 6.0–8.3)

## 2023-03-19 LAB — LDL CHOLESTEROL, DIRECT: Direct LDL: 142 mg/dL

## 2023-03-19 LAB — MICROALBUMIN / CREATININE URINE RATIO
Creatinine,U: 32.8 mg/dL
Microalb Creat Ratio: 2.1 mg/g (ref 0.0–30.0)
Microalb, Ur: <0.7 mg/dL (ref 0.0–1.9)

## 2023-03-19 LAB — TSH: TSH: 1.12 u[IU]/mL (ref 0.35–5.50)

## 2023-03-22 NOTE — Assessment & Plan Note (Signed)
Based on current lipid profile, the risk of clinically significant CAD is 9% over the next 10 years, using the [facility] Cardiac risk calculator. Repeat in one year   Lab Results  Component Value Date   CHOL 230 (H) 03/18/2023   HDL 74.30 03/18/2023   LDLCALC 141 (H) 03/18/2023   LDLDIRECT 142.0 03/18/2023   TRIG 75.0 03/18/2023   CHOLHDL 3 03/18/2023

## 2023-04-07 LAB — COLOGUARD

## 2023-04-08 ENCOUNTER — Telehealth: Payer: Self-pay

## 2023-04-08 DIAGNOSIS — Z1211 Encounter for screening for malignant neoplasm of colon: Secondary | ICD-10-CM

## 2023-04-08 NOTE — Telephone Encounter (Signed)
-----   Message from [name] sent at 04/07/2023  1:47 PM EST ----- Cologuard resut was incomplete .  She will not be charged but needs another one ordered and sent

## 2023-04-08 NOTE — Telephone Encounter (Signed)
LMTCB in regards to lab results.  

## 2023-04-09 NOTE — Telephone Encounter (Signed)
Cologuard order has been placed.

## 2023-04-09 NOTE — Addendum Note (Signed)
Addended by: Sandy Salaam on: 04/09/2023 05:55 PM   Modules accepted: Orders

## 2023-04-09 NOTE — Telephone Encounter (Signed)
Patient states she is returning a call from Sandy Salaam, CMA.  I read Dr. Rosey Bath Tullo's message to patient.  Patient states she will be looking for another Cologuard kit to be sent to her.

## 2023-04-20 LAB — COLOGUARD: COLOGUARD: NEGATIVE

## 2023-04-29 ENCOUNTER — Ambulatory Visit
Admission: RE | Admit: 2023-04-29 | Discharge: 2023-04-29 | Disposition: A | Discharge status: Home / Self Care | Payer: Medicare HMO | Source: Ambulatory Visit | Attending: Internal Medicine | Admitting: Internal Medicine

## 2023-04-29 DIAGNOSIS — Z1231 Encounter for screening mammogram for malignant neoplasm of breast: Secondary | ICD-10-CM | POA: Insufficient documentation

## 2023-11-06 ENCOUNTER — Other Ambulatory Visit: Payer: Self-pay | Admitting: Internal Medicine

## 2023-11-06 DIAGNOSIS — I10 Essential (primary) hypertension: Secondary | ICD-10-CM

## 2024-03-18 ENCOUNTER — Encounter: Admitting: Internal Medicine

## 2024-04-07 ENCOUNTER — Other Ambulatory Visit: Payer: Self-pay | Admitting: Internal Medicine

## 2024-04-07 DIAGNOSIS — Z1231 Encounter for screening mammogram for malignant neoplasm of breast: Secondary | ICD-10-CM

## 2024-04-15 ENCOUNTER — Encounter: Payer: Self-pay | Admitting: Internal Medicine

## 2024-04-15 ENCOUNTER — Ambulatory Visit (INDEPENDENT_AMBULATORY_CARE_PROVIDER_SITE_OTHER): Admitting: Internal Medicine

## 2024-04-15 VITALS — BP 132/60 | HR 76 | Ht 62.5 in | Wt 157.8 lb

## 2024-04-15 DIAGNOSIS — L900 Lichen sclerosus et atrophicus: Secondary | ICD-10-CM

## 2024-04-15 DIAGNOSIS — E785 Hyperlipidemia, unspecified: Secondary | ICD-10-CM

## 2024-04-15 DIAGNOSIS — Z23 Encounter for immunization: Secondary | ICD-10-CM

## 2024-04-15 DIAGNOSIS — Z0000 Encounter for general adult medical examination without abnormal findings: Secondary | ICD-10-CM | POA: Diagnosis not present

## 2024-04-15 DIAGNOSIS — R7303 Prediabetes: Secondary | ICD-10-CM | POA: Diagnosis not present

## 2024-04-15 DIAGNOSIS — I10 Essential (primary) hypertension: Secondary | ICD-10-CM

## 2024-04-15 DIAGNOSIS — E663 Overweight: Secondary | ICD-10-CM

## 2024-04-15 MED ORDER — TRIAMCINOLONE ACETONIDE 0.1 % EX CREA: 1.0000 | TOPICAL_CREAM | Freq: Two times a day (BID) | CUTANEOUS | 2 refills | Status: AC

## 2024-04-15 MED ORDER — METOPROLOL TARTRATE 25 MG PO TABS: 25.0000 mg | ORAL_TABLET | Freq: Every day | ORAL | 3 refills | Status: AC

## 2024-04-15 MED ORDER — CLOBETASOL PROPIONATE 0.05 % EX OINT: TOPICAL_OINTMENT | Freq: Two times a day (BID) | CUTANEOUS | 4 refills | Status: AC

## 2024-04-15 NOTE — Assessment & Plan Note (Addendum)
 Managing flares with clobetasol    refills given

## 2024-04-15 NOTE — Patient Instructions (Addendum)
 Please check BP at home. Goal is to keep  bp < 130/80  so send me 5 readings in a few weeks   Clobetasol  and triamcinolone refilled

## 2024-04-15 NOTE — Progress Notes (Signed)
 Patient ID: Elizabeth Hanna, female    DOB: 04/20/1956  Age: 68 y.o. MRN: [id number]  The patient is here for annual preventive examination and management of other chronic and acute problems.   The risk factors are reflected in the social history.   The roster of all physicians providing medical care to patient - is listed in the Snapshot section of the chart.   Activities of daily living:  The patient is 100% independent in all ADLs: dressing, toileting, feeding as well as independent mobility   Home safety : The patient has smoke detectors in the home. They wear seatbelts.  There are no unsecured firearms at home. There is no violence in the home.    There is no risks for hepatitis, STDs or HIV. There is no   history of blood transfusion. They have no travel history to infectious disease endemic areas of the world.   The patient has seen their dentist in the last six month. They have seen their eye doctor in the last year. The patinet  denies slight hearing difficulty with regard to whispered voices and some television programs.  They have deferred audiologic testing in the last year.  They do not  have excessive sun exposure. Discussed the need for sun protection: hats, long sleeves and use of sunscreen if there is significant sun exposure.    Diet: the importance of a healthy diet is discussed. They do have a healthy diet.   The benefits of regular aerobic exercise were discussed. The patient  exercises  3 to 5 days per week  for  60 minutes.    Depression screen: there are no signs or vegative symptoms of depression- irritability, change in appetite, anhedonia, sadness/tearfullness.   The following portions of the patient's history were reviewed and updated as appropriate: allergies, current medications, past family history, past medical history,  past surgical history, past social history  and problem list.   Visual acuity was not assessed per patient preference since the patient has  regular follow up with an  ophthalmologist. Hearing and body mass index were assessed and reviewed.    During the course of the visit the patient was educated and counseled about appropriate screening and preventive services including : fall prevention , diabetes screening, nutrition counseling, colorectal cancer screening, and recommended immunizations.    Chief Complaint:  none   Review of Symptoms  Patient denies headache, fevers, malaise, unintentional weight loss, skin rash, eye pain, sinus congestion and sinus pain, sore throat, dysphagia,  hemoptysis , cough, dyspnea, wheezing, chest pain, palpitations, orthopnea, edema, abdominal pain, nausea, melena, diarrhea, constipation, flank pain, dysuria, hematuria, urinary  Frequency, nocturia, numbness, tingling, seizures,  Focal weakness, Loss of consciousness,  Tremor, insomnia, depression, anxiety, and suicidal ideation.    Physical Exam:  BP 132/60   Pulse 76   Ht 5' 2.5 (1.588 m)   Wt 157 lb 12.8 oz (71.6 kg)   SpO2 97%   BMI 28.40 kg/m    Physical Exam Vitals reviewed.  Constitutional:      General: She is not in acute distress.    Appearance: Normal appearance. She is well-developed and normal weight. She is not ill-appearing, toxic-appearing or diaphoretic.  HENT:     Head: Normocephalic.     Right Ear: Tympanic membrane, ear canal and external ear normal. There is no impacted cerumen.     Left Ear: Tympanic membrane, ear canal and external ear normal. There is no impacted cerumen.  Nose: Nose normal.     Mouth/Throat:     Mouth: Mucous membranes are moist.     Pharynx: Oropharynx is clear.  Eyes:     General: No scleral icterus.       Right eye: No discharge.        Left eye: No discharge.     Conjunctiva/sclera: Conjunctivae normal.     Pupils: Pupils are equal, round, and reactive to light.  Neck:     Thyroid : No thyromegaly.     Vascular: No carotid bruit or JVD.  Cardiovascular:     Rate and Rhythm:  Normal rate and regular rhythm.     Heart sounds: Normal heart sounds.  Pulmonary:     Effort: Pulmonary effort is normal. No respiratory distress.     Breath sounds: Normal breath sounds.  Chest:  Breasts:    Breasts are symmetrical.     Right: Normal. No swelling, inverted nipple, mass, nipple discharge, skin change or tenderness.     Left: Normal. No swelling, inverted nipple, mass, nipple discharge, skin change or tenderness.  Abdominal:     General: Bowel sounds are normal.     Palpations: Abdomen is soft. There is no mass.     Tenderness: There is no abdominal tenderness. There is no guarding or rebound.  Musculoskeletal:        General: Normal range of motion.     Cervical back: Normal range of motion and neck supple.  Lymphadenopathy:     Cervical: No cervical adenopathy.     Upper Body:     Right upper body: No supraclavicular, axillary or pectoral adenopathy.     Left upper body: No supraclavicular, axillary or pectoral adenopathy.  Skin:    General: Skin is warm and dry.     Findings: No rash.  Neurological:     General: No focal deficit present.     Mental Status: She is alert and oriented to person, place, and time. Mental status is at baseline.  Psychiatric:        Mood and Affect: Mood normal.        Behavior: Behavior normal.        Thought Content: Thought content normal.        Judgment: Judgment normal.     Assessment and Plan: Encounter for preventative adult health care examination  Essential hypertension Assessment & Plan: Elevated today in office on current regimen of metoprolol   25 mg bid.  no changes today.  she reports compliance with medication regimen  and  has been asked to check his BP at work and  submit readings for evaluation. Renal function and screen for proteinuria are normal   Lab Results  Component Value Date   MICROALBUR <0.7 04/15/2024   Lab Results  Component Value Date   CREATININE 0.59 04/15/2024       Orders: -      Metoprolol  Tartrate; Take 1 tablet (25 mg total) by mouth daily.  Dispense: 90 tablet; Refill: 3 -     Comprehensive metabolic panel with GFR -     Microalbumin / creatinine urine ratio  Hyperlipidemia LDL goal <100 Assessment & Plan: Based on current lipid profile, the risk of clinically significant CAD is NOW 11 TO 15%. over the next 10 years, using the [facility] Cardiac risk calculator  Lab Results  Component Value Date   CHOL 213 (H) 04/15/2024   HDL 67.90 04/15/2024   LDLCALC 132 (H) 04/15/2024   LDLDIRECT 129.0 04/15/2024  TRIG 66.0 04/15/2024   CHOLHDL 3 04/15/2024     Orders: -     Lipid panel -     LDL cholesterol, direct  Prediabetes Assessment & Plan: She has been following a low glycemic index diet and exercising regularly.  A1c is stable   Lab Results  Component Value Date   HGBA1C 5.9 04/15/2024     Orders: -     Comprehensive metabolic panel with GFR -     Hemoglobin A1c  Overweight (BMI 25.0-29.9) -     CBC with Differential/Platelet  Lichen sclerosus et atrophicus Assessment & Plan: Managing flares with clobetasol    refills given    Need for influenza vaccination -     Flu vaccine HIGH DOSE PF(Fluzone Trivalent)  Preventative health care Assessment & Plan: age appropriate education and counseling updated, referrals for preventative services and immunizations addressed, dietary and smoking counseling addressed, most recent labs reviewed.  I have personally reviewed and have noted:   1) the patient's medical and social history 2) The pt's use of alcohol, tobacco, and illicit drugs 3) The patient's current medications and supplements 4) Functional ability including ADL's, fall risk, home safety risk, hearing and visual impairment 5) Diet and physical activities 6) Evidence for depression or mood disorder 7) The patient's height, weight, and BMI have been recorded in the chart   I have made referrals, and provided counseling and education based on  review of the above    Other orders -     Clobetasol  Propionate; Apply topically 2 (two) times daily. APPLY TOPICALLY TWICE DAILY S NEEDED FOR VAGINAL IRRITATION  Dispense: 60 g; Refill: 4 -     Triamcinolone  Acetonide; Apply 1 Application topically 2 (two) times daily.  Dispense: 80 g; Refill: 2    No follow-ups on file.  Verneita LITTIE Kettering, MD

## 2024-04-16 LAB — MICROALBUMIN / CREATININE URINE RATIO
Creatinine,U: 23.5 mg/dL
Microalb Creat Ratio: UNDETERMINED mg/g (ref 0.0–30.0)
Microalb, Ur: <0.7 mg/dL

## 2024-04-16 LAB — CBC WITH DIFFERENTIAL/PLATELET
Basophils Absolute: 0.1 K/uL (ref 0.0–0.1)
Basophils Relative: 1.1 % (ref 0.0–3.0)
Eosinophils Absolute: 0.1 K/uL (ref 0.0–0.7)
Eosinophils Relative: 1.3 % (ref 0.0–5.0)
HCT: 43.2 % (ref 36.0–46.0)
Hemoglobin: 14.4 g/dL (ref 12.0–15.0)
Lymphocytes Relative: 26.5 % (ref 12.0–46.0)
Lymphs Abs: 2 K/uL (ref 0.7–4.0)
MCHC: 33.4 g/dL (ref 30.0–36.0)
MCV: 92.7 fl (ref 78.0–100.0)
Monocytes Absolute: 0.5 K/uL (ref 0.1–1.0)
Monocytes Relative: 6.7 % (ref 3.0–12.0)
Neutro Abs: 4.9 K/uL (ref 1.4–7.7)
Neutrophils Relative %: 64.4 % (ref 43.0–77.0)
Platelets: 227 K/uL (ref 150.0–400.0)
RBC: 4.66 Mil/uL (ref 3.87–5.11)
RDW: 13 % (ref 11.5–15.5)
WBC: 7.7 K/uL (ref 4.0–10.5)

## 2024-04-16 LAB — COMPREHENSIVE METABOLIC PANEL WITH GFR
ALT: 18 U/L (ref 0–35)
AST: 19 U/L (ref 0–37)
Albumin: 4.7 g/dL (ref 3.5–5.2)
Alkaline Phosphatase: 52 U/L (ref 39–117)
BUN: 15 mg/dL (ref 6–23)
CO2: 27 meq/L (ref 19–32)
Calcium: 9.4 mg/dL (ref 8.4–10.5)
Chloride: 104 meq/L (ref 96–112)
Creatinine, Ser: 0.59 mg/dL (ref 0.40–1.20)
GFR: 91.86 mL/min (ref >60.00)
Glucose, Bld: 87 mg/dL (ref 70–99)
Potassium: 4.2 meq/L (ref 3.5–5.1)
Sodium: 139 meq/L (ref 135–145)
Total Bilirubin: 0.7 mg/dL (ref 0.2–1.2)
Total Protein: 6.9 g/dL (ref 6.0–8.3)

## 2024-04-16 LAB — LIPID PANEL
Cholesterol: 213 mg/dL — ABNORMAL HIGH (ref 0–200)
HDL: 67.9 mg/dL (ref >39.00)
LDL Cholesterol: 132 mg/dL — ABNORMAL HIGH (ref 0–99)
NonHDL: 145.47
Total CHOL/HDL Ratio: 3
Triglycerides: 66 mg/dL (ref 0.0–149.0)
VLDL: 13.2 mg/dL (ref 0.0–40.0)

## 2024-04-16 LAB — HEMOGLOBIN A1C: Hgb A1c MFr Bld: 5.9 % (ref 4.6–6.5)

## 2024-04-16 LAB — LDL CHOLESTEROL, DIRECT: Direct LDL: 129 mg/dL

## 2024-04-17 ENCOUNTER — Ambulatory Visit: Payer: Self-pay | Admitting: Internal Medicine

## 2024-04-17 NOTE — Assessment & Plan Note (Signed)

## 2024-04-17 NOTE — Assessment & Plan Note (Addendum)
 Elevated today in office on current regimen of metoprolol   25 mg bid.  no changes today.  she reports compliance with medication regimen  and  has been asked to check his BP at work and  submit readings for evaluation. Renal function and screen for proteinuria are normal   Lab Results  Component Value Date   MICROALBUR <0.7 04/15/2024   Lab Results  Component Value Date   CREATININE 0.59 04/15/2024

## 2024-04-17 NOTE — Assessment & Plan Note (Signed)
 She has been following a low glycemic index diet and exercising regularly.  A1c is stable   Lab Results  Component Value Date   HGBA1C 5.9 04/15/2024

## 2024-04-17 NOTE — Assessment & Plan Note (Addendum)
 Based on current lipid profile, the risk of clinically significant CAD is NOW 11 TO 15%. over the next 10 years, using the [facility] Cardiac risk calculator  Lab Results  Component Value Date   CHOL 213 (H) 04/15/2024   HDL 67.90 04/15/2024   LDLCALC 132 (H) 04/15/2024   LDLDIRECT 129.0 04/15/2024   TRIG 66.0 04/15/2024   CHOLHDL 3 04/15/2024

## 2024-05-13 ENCOUNTER — Inpatient Hospital Stay: Admission: RE | Admit: 2024-05-13 | Discharge: 2024-05-13 | Attending: Internal Medicine | Admitting: Internal Medicine

## 2024-05-13 ENCOUNTER — Telehealth: Payer: Self-pay | Admitting: Internal Medicine

## 2024-05-13 DIAGNOSIS — Z1231 Encounter for screening mammogram for malignant neoplasm of breast: Secondary | ICD-10-CM | POA: Insufficient documentation

## 2024-05-13 NOTE — Telephone Encounter (Signed)
 Pt dropped off list of BP readings. They're in Dr Lula color folder up front

## 2024-05-13 NOTE — Telephone Encounter (Signed)
 Placed in yellow results folder.

## 2024-05-15 NOTE — Telephone Encounter (Signed)
 Pt is aware and gave a verbal understanding.

## 2024-06-12 ENCOUNTER — Ambulatory Visit: Admitting: Internal Medicine

## 2024-06-12 ENCOUNTER — Ambulatory Visit: Payer: Self-pay | Admitting: *Deleted

## 2024-06-12 ENCOUNTER — Encounter: Payer: Self-pay | Admitting: Internal Medicine

## 2024-06-12 VITALS — BP 110/78 | HR 80 | Temp 98.3°F | Resp 16 | Ht 62.5 in | Wt 157.3 lb

## 2024-06-12 DIAGNOSIS — R350 Frequency of micturition: Secondary | ICD-10-CM

## 2024-06-12 LAB — POCT URINALYSIS DIPSTICK
Bilirubin, UA: NEGATIVE
Glucose, UA: NEGATIVE
Ketones, UA: NEGATIVE
Nitrite, UA: NEGATIVE
Protein, UA: POSITIVE — AB
Spec Grav, UA: 1.02
Urobilinogen, UA: 0.2 U/dL
pH, UA: 6

## 2024-06-12 MED ORDER — SULFAMETHOXAZOLE-TRIMETHOPRIM 800-160 MG PO TABS: 1.0000 | ORAL_TABLET | Freq: Two times a day (BID) | ORAL | 0 refills | Status: AC

## 2024-06-12 NOTE — Telephone Encounter (Signed)
 FYI Only or Action Required?: FYI only for provider: appointment scheduled on 1/16.  Patient was last seen in primary care on 04/15/2024 by Marylynn Verneita CROME, MD.  Called Nurse Triage reporting urinary pressure.  Symptoms began today.  Interventions attempted: Nothing.  Symptoms are: unchanged.  Triage Disposition: See Physician Within 24 Hours  Patient/caregiver understands and will follow disposition?: Yes  Reason for Triage: patient has pressure/feels like everything is going to fall out and pee is light pink, when wipe blood. After pee hurts/cramps.  Reason for Disposition  Urinating more frequently than usual (i.e., frequency) OR new-onset of the feeling of an urgent need to urinate (i.e., urgency)  Answer Assessment - Initial Assessment Questions 1. SYMPTOM: What's the main symptom you're concerned about? (e.g., frequency, incontinence)     frequency, pressure - feels like everything is going to fall out 2. ONSET: When did the  pressure  start?     today 3. PAIN: Is there any pain? If Yes, ask: How bad is it? (Scale: 1-10; mild, moderate, severe)     Pain with urination, 4/10 4. CAUSE: What do you think is causing the symptoms?     Unsure- feels like everything is going to fall out 5. OTHER SYMPTOMS: Do you have any other symptoms? (e.g., blood in urine, fever, flank pain, pain with urination)     Blood present- pink, frequency  Protocols used: Urinary Symptoms-A-AH

## 2024-06-12 NOTE — Progress Notes (Signed)
 "  Acute Office Visit  Subjective:     Patient ID: Elizabeth Hanna, female    DOB: 1955-08-05, 69 y.o.   MRN: [id number]  Chief Complaint  Patient presents with   Urinary Frequency    Started today, painful, frequency stated feels like inside falling out    Urinary Frequency  Associated symptoms include frequency and urgency. Pertinent negatives include no chills, flank pain or hematuria.   Patient is in today for urinary frequency. This is my first time meeting her.   Discussed the use of AI scribe software for clinical note transcription with the patient, who gave verbal consent to proceed.  History of Present Illness Elizabeth Hanna is a 69 year old female who presents with new onset urinary symptoms.  This morning she developed pelvic pressure and heaviness that make sitting uncomfortable, along with increased urinary frequency and urgency, often passing only small amounts. After urination she has cramp-like pain, not burning. She noticed pink discoloration in the toilet water and red blood on wiping, which prompted the visit. She has never had a prior urinary tract infection. She denies leg or back pain, fever, or increased fatigue.   Review of Systems  Constitutional:  Negative for chills and fever.  Gastrointestinal:  Negative for abdominal pain.  Genitourinary:  Positive for dysuria, frequency and urgency. Negative for flank pain and hematuria.        Objective:    BP 110/78 (Cuff Size: Large)   Pulse 80   Temp 98.3 F (36.8 C) (Oral)   Resp 16   Ht 5' 2.5 (1.588 m)   Wt 157 lb 4.8 oz (71.4 kg)   SpO2 98%   BMI 28.31 kg/m  BP Readings from Last 3 Encounters:  06/12/24 110/78  04/15/24 132/60  03/18/23 122/76   Wt Readings from Last 3 Encounters:  06/12/24 157 lb 4.8 oz (71.4 kg)  04/15/24 157 lb 12.8 oz (71.6 kg)  03/18/23 156 lb 9.6 oz (71 kg)      Physical Exam Constitutional:      Appearance: Normal appearance.  HENT:     Head:  Normocephalic and atraumatic.  Eyes:     Conjunctiva/sclera: Conjunctivae normal.  Cardiovascular:     Rate and Rhythm: Normal rate and regular rhythm.  Pulmonary:     Effort: Pulmonary effort is normal.     Breath sounds: Normal breath sounds.  Abdominal:     Tenderness: There is no right CVA tenderness or left CVA tenderness.  Skin:    General: Skin is warm and dry.  Neurological:     General: No focal deficit present.     Mental Status: She is alert. Mental status is at baseline.  Psychiatric:        Mood and Affect: Mood normal.        Behavior: Behavior normal.     Results for orders placed or performed in visit on 06/12/24  POCT urinalysis dipstick  Result Value Ref Range   Color, UA pink    Clarity, UA clear    Glucose, UA Negative Negative   Bilirubin, UA negative    Ketones, UA negative    Spec Grav, UA 1.020 1.010 - 1.025   Blood, UA large    pH, UA 6.0 5.0 - 8.0   Protein, UA Positive (A) Negative   Urobilinogen, UA 0.2 0.2 or 1.0 E.U./dL   Nitrite, UA negative    Leukocytes, UA Large (3+) (A) Negative   Appearance clear  Odor none         Assessment & Plan:   Assessment & Plan Acute urinary tract infection Urinalysis indicates inflammation with hematuria, proteinuria, and leukocytes. Differential includes UTI. Risk of pyelonephritis if untreated. - Prescribed Bactrim  twice daily for three days. - Sent urine sample for culture to confirm bacterial growth and antibiotic susceptibility. - Advised Azo for symptomatic relief, noting urine color change to orange. - Encouraged increased hydration. - Instructed to monitor for fever, flank pain, or severe abdominal pain and seek medical attention if these occur.  - POCT urinalysis dipstick - Urine Culture - sulfamethoxazole -trimethoprim  (BACTRIM  DS) 800-160 MG tablet; Take 1 tablet by mouth 2 (two) times daily for 3 days.  Dispense: 6 tablet; Refill: 0   Return if symptoms worsen or fail to  improve.  Sharyle Fischer, DO   "

## 2024-06-13 LAB — URINE CULTURE
MICRO NUMBER:: 17480099
Result:: NO GROWTH
SPECIMEN QUALITY:: ADEQUATE

## 2024-06-15 ENCOUNTER — Ambulatory Visit: Payer: Self-pay | Admitting: Internal Medicine

## 2025-04-16 ENCOUNTER — Encounter: Admitting: Internal Medicine
